# Patient Record
Sex: Male | Born: 1960
Health system: Southern US, Community
[De-identification: ages and names within clinical notes are randomized; demographics above are authoritative.]

## PROBLEM LIST (undated history)

## (undated) DIAGNOSIS — E039 Hypothyroidism, unspecified: Secondary | ICD-10-CM

## (undated) DIAGNOSIS — E042 Nontoxic multinodular goiter: Secondary | ICD-10-CM

## (undated) DIAGNOSIS — C61 Malignant neoplasm of prostate: Secondary | ICD-10-CM

## (undated) DIAGNOSIS — Z9889 Other specified postprocedural states: Secondary | ICD-10-CM

## (undated) DIAGNOSIS — K5090 Crohn's disease, unspecified, without complications: Secondary | ICD-10-CM

## (undated) DIAGNOSIS — R112 Nausea with vomiting, unspecified: Secondary | ICD-10-CM

## (undated) DIAGNOSIS — K219 Gastro-esophageal reflux disease without esophagitis: Secondary | ICD-10-CM

## (undated) DIAGNOSIS — K589 Irritable bowel syndrome without diarrhea: Secondary | ICD-10-CM

## (undated) HISTORY — DX: Crohn's disease, unspecified, without complications: K50.90

## (undated) HISTORY — DX: Gastro-esophageal reflux disease without esophagitis: K21.9

## (undated) HISTORY — PX: APPENDECTOMY: SHX54

## (undated) HISTORY — DX: Nontoxic multinodular goiter: E04.2

## (undated) HISTORY — DX: Irritable bowel syndrome, unspecified: K58.9

## (undated) HISTORY — DX: Malignant neoplasm of prostate: C61

## (undated) HISTORY — PX: KNEE ARTHROSCOPY W/ MENISCAL REPAIR: SHX1877

---

## 2003-11-05 ENCOUNTER — Encounter: Admission: RE | Admit: 2003-11-05 | Discharge: 2003-11-05 | Payer: Self-pay | Admitting: Neurological Surgery

## 2003-11-26 ENCOUNTER — Encounter: Admission: RE | Admit: 2003-11-26 | Discharge: 2003-11-26 | Payer: Self-pay | Admitting: Neurological Surgery

## 2004-11-03 ENCOUNTER — Emergency Department: Payer: Self-pay | Admitting: Emergency Medicine

## 2004-11-03 ENCOUNTER — Emergency Department: Payer: Self-pay | Admitting: Unknown Physician Specialty

## 2005-02-24 ENCOUNTER — Emergency Department: Payer: Self-pay | Admitting: Emergency Medicine

## 2005-05-22 ENCOUNTER — Emergency Department (HOSPITAL_COMMUNITY): Admission: EM | Admit: 2005-05-22 | Discharge: 2005-05-22 | Payer: Self-pay | Admitting: Emergency Medicine

## 2005-05-22 ENCOUNTER — Emergency Department: Payer: Self-pay | Admitting: Emergency Medicine

## 2006-04-24 ENCOUNTER — Emergency Department (HOSPITAL_COMMUNITY): Admission: EM | Admit: 2006-04-24 | Discharge: 2006-04-25 | Payer: Self-pay | Admitting: Emergency Medicine

## 2007-05-12 ENCOUNTER — Emergency Department: Payer: Self-pay | Admitting: Emergency Medicine

## 2008-01-10 ENCOUNTER — Emergency Department: Payer: Self-pay | Admitting: Emergency Medicine

## 2008-06-18 ENCOUNTER — Ambulatory Visit: Payer: Self-pay

## 2008-07-23 ENCOUNTER — Ambulatory Visit: Payer: Self-pay | Admitting: Unknown Physician Specialty

## 2008-07-27 ENCOUNTER — Ambulatory Visit: Payer: Self-pay | Admitting: Unknown Physician Specialty

## 2010-06-01 HISTORY — PX: ROBOT ASSISTED LAPAROSCOPIC RADICAL PROSTATECTOMY: SHX5141

## 2010-06-22 ENCOUNTER — Encounter: Payer: Self-pay | Admitting: Neurological Surgery

## 2010-06-23 ENCOUNTER — Ambulatory Visit: Payer: Self-pay | Admitting: Urology

## 2010-12-12 LAB — HM COLONOSCOPY

## 2013-01-23 ENCOUNTER — Ambulatory Visit (INDEPENDENT_AMBULATORY_CARE_PROVIDER_SITE_OTHER): Payer: Managed Care, Other (non HMO) | Admitting: Internal Medicine

## 2013-01-23 ENCOUNTER — Encounter: Payer: Self-pay | Admitting: Internal Medicine

## 2013-01-23 VITALS — BP 110/70 | HR 69 | Temp 98.6°F | Ht 67.0 in | Wt 170.0 lb

## 2013-01-23 DIAGNOSIS — K219 Gastro-esophageal reflux disease without esophagitis: Secondary | ICD-10-CM

## 2013-01-23 DIAGNOSIS — K589 Irritable bowel syndrome without diarrhea: Secondary | ICD-10-CM | POA: Insufficient documentation

## 2013-01-23 DIAGNOSIS — K5090 Crohn's disease, unspecified, without complications: Secondary | ICD-10-CM

## 2013-01-23 DIAGNOSIS — E042 Nontoxic multinodular goiter: Secondary | ICD-10-CM

## 2013-01-23 DIAGNOSIS — E890 Postprocedural hypothyroidism: Secondary | ICD-10-CM | POA: Insufficient documentation

## 2013-01-23 DIAGNOSIS — J029 Acute pharyngitis, unspecified: Secondary | ICD-10-CM

## 2013-01-23 DIAGNOSIS — Z8546 Personal history of malignant neoplasm of prostate: Secondary | ICD-10-CM | POA: Insufficient documentation

## 2013-01-23 DIAGNOSIS — C61 Malignant neoplasm of prostate: Secondary | ICD-10-CM | POA: Insufficient documentation

## 2013-01-23 NOTE — Progress Notes (Signed)
Subjective:    Patient ID: Gerald Atkinson, male    DOB: 1961-02-03, 52 y.o.   MRN: [id number]  HPI Here with sore throat for about 5 days "can hear it in there" Started amoxicillin 4 days ago--- hasn't helped  Doing okay during the day but it burns at night Slight cough--occ brings up some whitish phlegm No fever Does have burning with swallowing---no dysphagia though  Gargled with salt water last night--slight help advil some help  Had goiter found years ago-- 1990's Got radioactive iodine Probably multinodular goiter Sees Dr Tedd Sias is following---has gotten a couple of biopsies over time  GERD is ongoing thing Started with Crohn's diagnosis Gets water brash if he doesn't take omeprazole  Crohn's diagnosed ~1999 Last saw Dr Servando Snare for this in 2012 Some activity on colonoscopy then--or some narrowing? pentasa no help Now sees Dr Johnnette Litter steroid product and he didn't want it He feels it is diet controlled Just cramping --no diarrhea. Better if he avoids lactose levsin does help it No bleeding with stools Does take probiotics Had been hospitalized multiple times---but none in the past 5 years Never had fistula  Prostate cancer Prostatectomy 2012 Sees Dr Achilles Dunk  No current outpatient prescriptions on file prior to visit.   No current facility-administered medications on file prior to visit.    No Known Allergies  Past Medical History  Diagnosis Date  . Crohn disease   . IBS (irritable bowel syndrome)   . GERD (gastroesophageal reflux disease)   . Prostate cancer   . Multinodular goiter     with hypothyroidism after RAI    Past Surgical History  Procedure Laterality Date  . Robot assisted laparoscopic radical prostatectomy  2012  . Appendectomy    . Knee arthroscopy w/ meniscal repair Bilateral ~2000 &2009    Dr Erin Sons    Family History  Problem Relation Age of Onset  . Cancer Father     bladder  . Diabetes Neg Hx   . Heart disease  Neg Hx     History   Social History  . Marital Status: Divorced    Spouse Name: N/A    Number of Children: 2  . Years of Education: N/A   Occupational History  . Dentist    Social History Main Topics  . Smoking status: Never Smoker   . Smokeless tobacco: Never Used  . Alcohol Use: Yes  . Drug Use: No  . Sexual Activity: Not on file   Other Topics Concern  . Not on file   Social History Narrative  . No narrative on file   Review of Systems  Constitutional: Negative for fatigue and unexpected weight change.  HENT: Positive for sore throat and trouble swallowing.   Respiratory: Positive for cough and wheezing. Negative for shortness of breath.        Slight wheezing with this  Cardiovascular: Positive for chest pain.       Gets chest pain a couple of days after doing weight work in gym  Gastrointestinal: Positive for abdominal pain. Negative for blood in stool and anal bleeding.       Intermittent cramping  Genitourinary: Negative for difficulty urinating.       Occ incontinence since prostatectomy  Musculoskeletal: Positive for back pain. Negative for joint swelling and arthralgias.       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  Psychiatric/Behavioral: Negative for sleep disturbance  and dysphoric mood. The patient is not nervous/anxious.        Objective:   Physical Exam  Constitutional: He appears well-developed and well-nourished. No distress.  HENT:  Mild pharyngeal injection No exudates No tonsillar swelling or evidence of abscess TMs normal Mild nasal inflammation  Neck: Normal range of motion. Neck supple. Thyromegaly present.  Nodular thyroid felt Not overly enlarged  Cardiovascular: Normal rate, regular rhythm and normal heart sounds.  Exam reveals no gallop.   No murmur heard. Pulmonary/Chest: Effort normal and breath sounds normal. No  respiratory distress. He has no wheezes. He has no rales.  Abdominal: Soft. He exhibits no distension. There is no tenderness. There is no rebound and no guarding.  Musculoskeletal: He exhibits no edema and no tenderness.  Lymphadenopathy:    He has no cervical adenopathy.  Psychiatric: He has a normal mood and affect. His behavior is normal.          Assessment & Plan:

## 2013-01-23 NOTE — Assessment & Plan Note (Signed)
Seems to be quiet Should probably get colonoscopy again next year or so to confirm not active

## 2013-01-23 NOTE — Assessment & Plan Note (Signed)
Small Hypothyroid now presumably Dr Tedd Sias follows

## 2013-01-23 NOTE — Assessment & Plan Note (Signed)
Seems to be viral No signs of abscess No better on antibiotic Discussed supportive care

## 2013-01-23 NOTE — Assessment & Plan Note (Signed)
Does okay on PPI Symptoms if he doesn't

## 2013-07-31 ENCOUNTER — Encounter: Payer: Managed Care, Other (non HMO) | Admitting: Internal Medicine

## 2013-12-11 ENCOUNTER — Encounter: Payer: Self-pay | Admitting: Internal Medicine

## 2013-12-11 ENCOUNTER — Ambulatory Visit (INDEPENDENT_AMBULATORY_CARE_PROVIDER_SITE_OTHER): Payer: Managed Care, Other (non HMO) | Admitting: Internal Medicine

## 2013-12-11 VITALS — BP 110/70 | HR 61 | Temp 98.0°F | Ht 67.5 in | Wt 171.0 lb

## 2013-12-11 DIAGNOSIS — K5090 Crohn's disease, unspecified, without complications: Secondary | ICD-10-CM

## 2013-12-11 DIAGNOSIS — C61 Malignant neoplasm of prostate: Secondary | ICD-10-CM

## 2013-12-11 DIAGNOSIS — E890 Postprocedural hypothyroidism: Secondary | ICD-10-CM

## 2013-12-11 DIAGNOSIS — Z0000 Encounter for general adult medical examination without abnormal findings: Secondary | ICD-10-CM | POA: Insufficient documentation

## 2013-12-11 NOTE — Assessment & Plan Note (Signed)
Sees Dr Gabriel Carina Will check labs

## 2013-12-11 NOTE — Assessment & Plan Note (Signed)
Fairly healthy UTD with imms and cancer screening

## 2013-12-11 NOTE — Assessment & Plan Note (Signed)
Has been quiet 

## 2013-12-11 NOTE — Progress Notes (Signed)
Pre visit review using our clinic review tool, if applicable. No additional management support is needed unless otherwise documented below in the visit note. 

## 2013-12-11 NOTE — Progress Notes (Signed)
Subjective:    Patient ID: Gerald Atkinson, male    DOB: 14-Jan-1961, 53 y.o.   MRN: [id number]  HPI Here for physical Did have a friend die of early heart disease recently Has had elevated cholesterol in the past--has tried omega-3, etc for dietary treatment. This did help Walks regularly --has long driveway (like 3 miles) Does push ups also  Crohn's is doing well Uses probiotics for many years-- added prebiotic recently and this has really helped. No discomfort after eating food now Colonoscopy with Dr Allen Norris about 3 years ago--was okay  Had been on prilosec for a while If not, he gets water brash This is better with the prebiotic also Now taking ranitidine  Current Outpatient Prescriptions on File Prior to Visit  Medication Sig Dispense Refill  . hyoscyamine (LEVSIN SL) 0.125 MG SL tablet Place 0.125 mg under the tongue every 4 (four) hours as needed.      Marland Kitchen levothyroxine (SYNTHROID, LEVOTHROID) 150 MCG tablet Take 150 mcg by mouth daily before breakfast.      . liothyronine (CYTOMEL) 5 MCG tablet Take 5 mcg by mouth daily.       No current facility-administered medications on file prior to visit.    No Known Allergies  Past Medical History  Diagnosis Date  . Crohn disease   . IBS (irritable bowel syndrome)   . GERD (gastroesophageal reflux disease)   . Prostate cancer   . Multinodular goiter     with hypothyroidism after RAI    Past Surgical History  Procedure Laterality Date  . Robot assisted laparoscopic radical prostatectomy  2012  . Appendectomy    . Knee arthroscopy w/ meniscal repair Bilateral ~2000 &2009    Dr Leanor Kail    Family History  Problem Relation Age of Onset  . Cancer Father     bladder  . Diabetes Neg Hx   . Heart disease Neg Hx     History   Social History  . Marital Status: Divorced    Spouse Name: N/A    Number of Children: 2  . Years of Education: N/A   Occupational History  . Dentist    Social History Main Topics  .  Smoking status: Never Smoker   . Smokeless tobacco: Never Used  . Alcohol Use: Yes  . Drug Use: No  . Sexual Activity: Not on file   Other Topics Concern  . Not on file   Social History Narrative  . No narrative on file   Review of Systems  Constitutional: Negative for fatigue and unexpected weight change.       Wears seat belt  HENT: Negative for dental problem, hearing loss and tinnitus.   Eyes: Negative for visual disturbance.       No diplopia or unilateral vision loss  Respiratory: Negative for cough, chest tightness and shortness of breath.   Cardiovascular: Negative for chest pain, palpitations and leg swelling.  Gastrointestinal: Negative for nausea, vomiting, constipation and blood in stool.  Endocrine: Positive for cold intolerance. Negative for heat intolerance.       Hands stay cold Still sees Dr Gabriel Carina  Genitourinary: Negative for difficulty urinating.       Occ leakage --like with beer No major ED now Hasn't seen Dr Jacqlyn Larsen lately  Musculoskeletal: Positive for arthralgias, back pain and myalgias.       Will get more muscle pain when doing weights (so he has cut down on it) Right 3rd finger pain--saw Dr Precious Reel  some years ago  Skin: Negative for rash.       No suspicious lesions--yearly exam with Dr Evorn Gong  Allergic/Immunologic: Negative for environmental allergies and immunocompromised state.  Neurological: Negative for dizziness, syncope, weakness, light-headedness, numbness and headaches.       Occ sciatic pain from back--sees Dr Joyce Copa regularly  Hematological: Negative for adenopathy. Does not bruise/bleed easily.  Psychiatric/Behavioral: Negative for sleep disturbance and dysphoric mood. The patient is not nervous/anxious.        Sleeps in adjustable bed       Objective:   Physical Exam  Constitutional: He is oriented to person, place, and time. He appears well-developed and well-nourished. No distress.  HENT:  Head: Normocephalic and atraumatic.    Right Ear: External ear normal.  Left Ear: External ear normal.  Mouth/Throat: Oropharynx is clear and moist. No oropharyngeal exudate.  Eyes: Conjunctivae and EOM are normal. Pupils are equal, round, and reactive to light.  Neck: Normal range of motion. Neck supple. No thyromegaly present.  Cardiovascular: Normal rate, regular rhythm, normal heart sounds and intact distal pulses.  Exam reveals no gallop.   No murmur heard. Pulmonary/Chest: Effort normal and breath sounds normal. No respiratory distress. He has no wheezes. He has no rales.  Abdominal: Soft. There is no tenderness.  Musculoskeletal: He exhibits no edema and no tenderness.  Lymphadenopathy:    He has no cervical adenopathy.  Neurological: He is alert and oriented to person, place, and time.  Skin: No rash noted. No erythema.  Multiple benign nevi  Psychiatric: He has a normal mood and affect. His behavior is normal.          Assessment & Plan:

## 2013-12-11 NOTE — Assessment & Plan Note (Signed)
3 years ago Will check PSA

## 2013-12-12 LAB — CBC WITH DIFFERENTIAL/PLATELET
BASOS ABS: 0 10*3/uL (ref 0.0–0.1)
Basophils Relative: 0.3 % (ref 0.0–3.0)
EOS PCT: 1.5 % (ref 0.0–5.0)
Eosinophils Absolute: 0.1 10*3/uL (ref 0.0–0.7)
HCT: 40.7 % (ref 39.0–52.0)
Hemoglobin: 13.7 g/dL (ref 13.0–17.0)
LYMPHS ABS: 2.3 10*3/uL (ref 0.7–4.0)
LYMPHS PCT: 24.6 % (ref 12.0–46.0)
MCHC: 33.6 g/dL (ref 30.0–36.0)
MCV: 89.9 fl (ref 78.0–100.0)
MONOS PCT: 6.4 % (ref 3.0–12.0)
Monocytes Absolute: 0.6 10*3/uL (ref 0.1–1.0)
NEUTROS PCT: 67.2 % (ref 43.0–77.0)
Neutro Abs: 6.3 10*3/uL (ref 1.4–7.7)
PLATELETS: 275 10*3/uL (ref 150.0–400.0)
RBC: 4.53 Mil/uL (ref 4.22–5.81)
RDW: 13.3 % (ref 11.5–15.5)
WBC: 9.3 10*3/uL (ref 4.0–10.5)

## 2013-12-12 LAB — LIPID PANEL
CHOLESTEROL: 187 mg/dL (ref 0–200)
HDL: 39.6 mg/dL (ref >39.00)
LDL Cholesterol: 128 mg/dL — ABNORMAL HIGH (ref 0–99)
NONHDL: 147.4
TRIGLYCERIDES: 98 mg/dL (ref 0.0–149.0)
Total CHOL/HDL Ratio: 5
VLDL: 19.6 mg/dL (ref 0.0–40.0)

## 2013-12-12 LAB — COMPREHENSIVE METABOLIC PANEL
ALBUMIN: 3.7 g/dL (ref 3.5–5.2)
ALT: 16 U/L (ref 0–53)
AST: 18 U/L (ref 0–37)
Alkaline Phosphatase: 45 U/L (ref 39–117)
BILIRUBIN TOTAL: 0.6 mg/dL (ref 0.2–1.2)
BUN: 17 mg/dL (ref 6–23)
CALCIUM: 9 mg/dL (ref 8.4–10.5)
CHLORIDE: 104 meq/L (ref 96–112)
CO2: 26 meq/L (ref 19–32)
Creatinine, Ser: 1 mg/dL (ref 0.4–1.5)
GFR: 87.59 mL/min (ref >60.00)
GLUCOSE: 93 mg/dL (ref 70–99)
Potassium: 4.2 mEq/L (ref 3.5–5.1)
SODIUM: 139 meq/L (ref 135–145)
TOTAL PROTEIN: 6.3 g/dL (ref 6.0–8.3)

## 2013-12-12 LAB — TSH: TSH: 2.51 u[IU]/mL (ref 0.35–4.50)

## 2013-12-12 LAB — T3, FREE: T3, Free: 3 pg/mL (ref 2.3–4.2)

## 2013-12-12 LAB — T4, FREE: FREE T4: 1.13 ng/dL (ref 0.60–1.60)

## 2013-12-12 LAB — PSA: PSA: 0.01 ng/mL — ABNORMAL LOW (ref 0.10–4.00)

## 2013-12-13 ENCOUNTER — Encounter: Payer: Self-pay | Admitting: *Deleted

## 2015-10-02 ENCOUNTER — Telehealth: Payer: Self-pay

## 2015-10-02 NOTE — Telephone Encounter (Signed)
Bright Red Rectal Bleeding today after having bowel movement. States that their was only a few drops in the toilet and then had to wipe several times due to bloody toilet paper, no bleeding in underwear. Has only occurred once today. Denies abdominal pain. Denies Constipation or diarrhea and nausea or vomiting.   Has Crohn's Disease per patient. Has also had rectal fissures in past but is not having any rectal pain and feels that this is different.  Requesting colonoscopy as last one with Dr. Allen Norris was 5 years ago.

## 2015-10-03 ENCOUNTER — Other Ambulatory Visit: Payer: Self-pay

## 2015-10-03 ENCOUNTER — Telehealth: Payer: Self-pay

## 2015-10-03 NOTE — Telephone Encounter (Signed)
Pt scheduled for colonoscopy at [facility] on 10/14/15. Please precert for Crohn's ([id number])

## 2015-10-03 NOTE — Telephone Encounter (Signed)
Gastroenterology Pre-Procedure Review  Request Date: 10/14/15 Requesting Physician: Dr.   PATIENT REVIEW QUESTIONS: The patient responded to the following health history questions as indicated:    1. Are you having any GI issues? yes (Crohn's disease) 2. Do you have a personal history of Polyps? no 3. Do you have a family history of Colon Cancer or Polyps? no 4. Diabetes Mellitus? no 5. Joint replacements in the past 12 months?no 6. Major health problems in the past 3 months?no 7. Any artificial heart valves, MVP, or defibrillator?no    MEDICATIONS & ALLERGIES:    Patient reports the following regarding taking any anticoagulation/antiplatelet therapy:   Plavix, Coumadin, Eliquis, Xarelto, Lovenox, Pradaxa, Brilinta, or Effient? no Aspirin? yes (ASA 81mg )  Patient confirms/reports the following medications:  Current Outpatient Prescriptions  Medication Sig Dispense Refill  . aspirin EC 81 MG tablet Take 81 mg by mouth every other day.    . hyoscyamine (LEVSIN SL) 0.125 MG SL tablet Place 0.125 mg under the tongue every 4 (four) hours as needed.    Marland Kitchen levothyroxine (SYNTHROID, LEVOTHROID) 150 MCG tablet Take 150 mcg by mouth daily before breakfast.    . liothyronine (CYTOMEL) 5 MCG tablet Take 5 mcg by mouth daily.    . ranitidine (ZANTAC) 150 MG capsule Take 150 mg by mouth every evening.     No current facility-administered medications for this visit.    Patient confirms/reports the following allergies:  No Known Allergies  No orders of the defined types were placed in this encounter.    AUTHORIZATION INFORMATION Primary Insurance: 1D#: Group #:  Secondary Insurance: 1D#: Group #:  SCHEDULE INFORMATION: Date: 10/14/15 Time: Location: [place]

## 2015-10-03 NOTE — Telephone Encounter (Signed)
Colonoscopy scheduled at [facility] on 10/14/15. Instructs/rx mailed.

## 2015-10-10 NOTE — Discharge Instructions (Signed)

## 2015-10-14 ENCOUNTER — Ambulatory Visit: Payer: Managed Care, Other (non HMO) | Admitting: Anesthesiology

## 2015-10-14 ENCOUNTER — Ambulatory Visit
Admission: RE | Admit: 2015-10-14 | Discharge: 2015-10-14 | Disposition: A | Discharge status: Home / Self Care | Payer: Managed Care, Other (non HMO) | Source: Ambulatory Visit | Attending: Gastroenterology | Admitting: Gastroenterology

## 2015-10-14 ENCOUNTER — Encounter
Admission: RE | Disposition: A | Discharge status: Home / Self Care | Payer: Self-pay | Source: Ambulatory Visit | Attending: Gastroenterology

## 2015-10-14 DIAGNOSIS — Z8052 Family history of malignant neoplasm of bladder: Secondary | ICD-10-CM | POA: Insufficient documentation

## 2015-10-14 DIAGNOSIS — K219 Gastro-esophageal reflux disease without esophagitis: Secondary | ICD-10-CM | POA: Insufficient documentation

## 2015-10-14 DIAGNOSIS — Z79899 Other long term (current) drug therapy: Secondary | ICD-10-CM | POA: Diagnosis not present

## 2015-10-14 DIAGNOSIS — K589 Irritable bowel syndrome without diarrhea: Secondary | ICD-10-CM | POA: Diagnosis not present

## 2015-10-14 DIAGNOSIS — K5730 Diverticulosis of large intestine without perforation or abscess without bleeding: Secondary | ICD-10-CM | POA: Diagnosis not present

## 2015-10-14 DIAGNOSIS — K5000 Crohn's disease of small intestine without complications: Secondary | ICD-10-CM | POA: Diagnosis not present

## 2015-10-14 DIAGNOSIS — E039 Hypothyroidism, unspecified: Secondary | ICD-10-CM | POA: Insufficient documentation

## 2015-10-14 DIAGNOSIS — K529 Noninfective gastroenteritis and colitis, unspecified: Secondary | ICD-10-CM | POA: Diagnosis not present

## 2015-10-14 DIAGNOSIS — K640 First degree hemorrhoids: Secondary | ICD-10-CM | POA: Insufficient documentation

## 2015-10-14 DIAGNOSIS — K5080 Crohn's disease of both small and large intestine without complications: Secondary | ICD-10-CM | POA: Insufficient documentation

## 2015-10-14 DIAGNOSIS — K633 Ulcer of intestine: Secondary | ICD-10-CM | POA: Insufficient documentation

## 2015-10-14 DIAGNOSIS — Z8546 Personal history of malignant neoplasm of prostate: Secondary | ICD-10-CM | POA: Diagnosis not present

## 2015-10-14 HISTORY — DX: Nausea with vomiting, unspecified: R11.2

## 2015-10-14 HISTORY — DX: Other specified postprocedural states: Z98.890

## 2015-10-14 HISTORY — DX: Hypothyroidism, unspecified: E03.9

## 2015-10-14 HISTORY — PX: COLONOSCOPY WITH PROPOFOL: SHX5780

## 2015-10-14 SURGERY — COLONOSCOPY WITH PROPOFOL
Anesthesia: Monitor Anesthesia Care

## 2015-10-14 MED ORDER — PROPOFOL 10 MG/ML IV BOLUS
INTRAVENOUS | Status: DC | PRN
  Administered 2015-10-14 (×2): 50 mg via INTRAVENOUS
  Administered 2015-10-14: 100 mg via INTRAVENOUS

## 2015-10-14 MED ORDER — LIDOCAINE HCL (CARDIAC) 20 MG/ML IV SOLN
INTRAVENOUS | Status: DC | PRN
  Administered 2015-10-14: 40 mg via INTRAVENOUS

## 2015-10-14 MED ORDER — LACTATED RINGERS IV SOLN
INTRAVENOUS | Status: DC
  Administered 2015-10-14 (×2): via INTRAVENOUS

## 2015-10-14 MED ORDER — STERILE WATER FOR IRRIGATION IR SOLN
Status: DC | PRN
  Administered 2015-10-14: 08:00:00

## 2015-10-14 SURGICAL SUPPLY — 22 items
CANISTER SUCT 1200ML W/VALVE (MISCELLANEOUS) ×3 IMPLANT
CLIP HMST 235XBRD CATH ROT (MISCELLANEOUS) IMPLANT
CLIP RESOLUTION 360 11X235 (MISCELLANEOUS)
FCP ESCP3.2XJMB 240X2.8X (MISCELLANEOUS)
FORCEPS BIOP RAD 4 LRG CAP 4 (CUTTING FORCEPS) IMPLANT
FORCEPS BIOP RJ4 240 W/NDL (MISCELLANEOUS)
FORCEPS ESCP3.2XJMB 240X2.8X (MISCELLANEOUS) IMPLANT
GOWN CVR UNV OPN BCK APRN NK (MISCELLANEOUS) ×2 IMPLANT
GOWN ISOL THUMB LOOP REG UNIV (MISCELLANEOUS) ×6
INJECTOR VARIJECT VIN23 (MISCELLANEOUS) IMPLANT
KIT DEFENDO VALVE AND CONN (KITS) IMPLANT
KIT ENDO PROCEDURE OLY (KITS) ×3 IMPLANT
MARKER SPOT ENDO TATTOO 5ML (MISCELLANEOUS) IMPLANT
PAD GROUND ADULT SPLIT (MISCELLANEOUS) IMPLANT
PROBE APC STR FIRE (PROBE) IMPLANT
SNARE SHORT THROW 13M SML OVAL (MISCELLANEOUS) ×2 IMPLANT
SNARE SHORT THROW 30M LRG OVAL (MISCELLANEOUS) IMPLANT
SNARE SNG USE RND 15MM (INSTRUMENTS) IMPLANT
SPOT EX ENDOSCOPIC TATTOO (MISCELLANEOUS)
TRAP ETRAP POLY (MISCELLANEOUS) IMPLANT
VARIJECT INJECTOR VIN23 (MISCELLANEOUS)
WATER STERILE IRR 250ML POUR (IV SOLUTION) ×3 IMPLANT

## 2015-10-14 NOTE — Anesthesia Postprocedure Evaluation (Signed)
Anesthesia Post Note  Patient: Gerald Atkinson  Procedure(s) Performed: Procedure(s) (LRB): COLONOSCOPY WITH PROPOFOL (N/A)  Patient location during evaluation: PACU Anesthesia Type: MAC Level of consciousness: awake and alert Pain management: pain level controlled Vital Signs Assessment: post-procedure vital signs reviewed and stable Respiratory status: spontaneous breathing, nonlabored ventilation, respiratory function stable and patient connected to nasal cannula oxygen Cardiovascular status: stable and blood pressure returned to baseline Anesthetic complications: no    Crockett Rallo C

## 2015-10-14 NOTE — Transfer of Care (Signed)
Immediate Anesthesia Transfer of Care Note  Patient: Gerald Atkinson  Procedure(s) Performed: Procedure(s): COLONOSCOPY WITH PROPOFOL (N/A)  Patient Location: PACU  Anesthesia Type: MAC  Level of Consciousness: awake, alert  and patient cooperative  Airway and Oxygen Therapy: Patient Spontanous Breathing and Patient connected to supplemental oxygen  Post-op Assessment: Post-op Vital signs reviewed, Patient's Cardiovascular Status Stable, Respiratory Function Stable, Patent Airway and No signs of Nausea or vomiting  Post-op Vital Signs: Reviewed and stable  Complications: No apparent anesthesia complications

## 2015-10-14 NOTE — Anesthesia Procedure Notes (Signed)
Procedure Name: MAC Performed by: Miya Luviano Pre-anesthesia Checklist: Patient identified, Emergency Drugs available, Suction available, Patient being monitored and Timeout performed Patient Re-evaluated:Patient Re-evaluated prior to inductionOxygen Delivery Method: Nasal cannula       

## 2015-10-14 NOTE — Op Note (Signed)
[facility] Gastroenterology Patient Name: Kazim Pierman Procedure Date: 10/14/2015 8:09 AM MRN: [id number] Account #: [id number] Date of Birth: 1961/04/29 Admit Type: Outpatient Age: 55 Room: [facility] OR ROOM 01 Gender: Male Note Status: Finalized Procedure:            Colonoscopy Indications:          High risk colon cancer surveillance: Crohn's disease                        small intestine, High risk colon cancer surveillance:                        Crohn's small and large intestine Providers:            Lucilla Lame, MD Referring MD:         Venia Carbon (Referring MD) Medicines:            Propofol per Anesthesia Complications:        No immediate complications. Procedure:            Pre-Anesthesia Assessment:                       - Prior to the procedure, a History and Physical was                        performed, and patient medications and allergies were                        reviewed. The patient's tolerance of previous                        anesthesia was also reviewed. The risks and benefits of                        the procedure and the sedation options and risks were                        discussed with the patient. All questions were                        answered, and informed consent was obtained. Prior                        Anticoagulants: The patient has taken no previous                        anticoagulant or antiplatelet agents. ASA Grade                        Assessment: II - A patient with mild systemic disease.                        After reviewing the risks and benefits, the patient was                        deemed in satisfactory condition to undergo the                        procedure.  After obtaining informed consent, the colonoscope was                        passed under direct vision. Throughout the procedure,                        the patient's blood pressure, pulse, and oxygen      saturations were monitored continuously. The Olympus                        CF-HQ190L Colonoscope (S#. [id number]) was introduced                        through the anus and advanced to the the ileocecal                        valve. The colonoscopy was performed without                        difficulty. The patient tolerated the procedure well.                        The quality of the bowel preparation was excellent. Findings:      The perianal and digital rectal examinations were normal.      A few small-mouthed diverticula were found in the sigmoid colon.      Non-bleeding internal hemorrhoids were found during retroflexion. The       hemorrhoids were Grade I (internal hemorrhoids that do not prolapse).      Localized inflammation, moderate in severity and characterized by       serpentine ulcerations was found in the terminal ileum. Biopsies were       taken with a cold forceps for histology.      Discontinuous areas of nonbleeding ulcerated mucosa with no stigmata of       recent bleeding were present at the ileocecal valve. Impression:           - Diverticulosis in the sigmoid colon.                       - Non-bleeding internal hemorrhoids.                       - Crohn's disease. Biopsied.                       - Mucosal ulceration. Recommendation:       - Await pathology results. Procedure Code(s):    --- Professional ---                       [phone], Colonoscopy, flexible; with biopsy, single or                        multiple Diagnosis Code(s):    --- Professional ---                       K50.00, Crohn's disease of small intestine without                        complications  K64.0, First degree hemorrhoids                       K63.3, Ulcer of intestine                       K57.30, Diverticulosis of large intestine without                        perforation or abscess without bleeding CPT copyright 2016 American Medical Association. All rights  reserved. The codes documented in this report are preliminary and upon coder review may  be revised to meet current compliance requirements. Lucilla Lame, MD 10/14/2015 8:37:46 AM This report has been signed electronically. Number of Addenda: 0 Note Initiated On: 10/14/2015 8:09 AM Scope Withdrawal Time: 0 hours 6 minutes 52 seconds  Total Procedure Duration: 0 hours 10 minutes 5 seconds       [facility]

## 2015-10-14 NOTE — H&P (Signed)
[facility]  [address], [place], [place] [address] Phone: [phone] Fax : [phone]  Primary Care Physician:  Viviana Simpler, MD Primary Gastroenterologist:  Dr. Allen Norris  Pre-Procedure History & Physical: HPI:  Gerald Atkinson is a 55 y.o. male is here for an colonoscopy.   Past Medical History  Diagnosis Date  . Crohn disease ([place])   . IBS (irritable bowel syndrome)   . GERD (gastroesophageal reflux disease)   . Prostate cancer ([place])   . Multinodular goiter     with hypothyroidism after RAI  . Hypothyroidism   . PONV (postoperative nausea and vomiting)     Past Surgical History  Procedure Laterality Date  . Robot assisted laparoscopic radical prostatectomy  2012  . Appendectomy    . Knee arthroscopy w/ meniscal repair Bilateral ~2000 &2009    Dr Leanor Kail    Prior to Admission medications   Medication Sig Start Date End Date Taking? Authorizing Provider  APPLE CIDER VINEGAR PO Take by mouth. spoonful   Yes Historical Provider, MD  hyoscyamine (LEVSIN SL) 0.125 MG SL tablet Place 0.125 mg under the tongue every 4 (four) hours as needed.   Yes Historical Provider, MD  levothyroxine (SYNTHROID, LEVOTHROID) 150 MCG tablet Take 150 mcg by mouth daily. PM   Yes Historical Provider, MD  liothyronine (CYTOMEL) 5 MCG tablet Take 5 mcg by mouth daily.   Yes Historical Provider, MD  meloxicam (MOBIC) 15 MG tablet Take 15 mg by mouth daily.   Yes Historical Provider, MD  Omega-3 Fatty Acids (FISH OIL CONCENTRATE PO) Take by mouth. Spoonful cod liver oil   Yes Historical Provider, MD  Probiotic Product (PROBIOTIC DAILY PO) Take by mouth.   Yes Historical Provider, MD  ranitidine (ZANTAC) 150 MG capsule Take 150 mg by mouth every other day. am   Yes Historical Provider, MD    Allergies as of 10/03/2015  . (No Known Allergies)    Family History  Problem Relation Age of Onset  . Cancer Father     bladder  . Diabetes Neg Hx   . Heart disease Neg  Hx     Social History   Social History  . Marital Status: Divorced    Spouse Name: N/A  . Number of Children: 2  . Years of Education: N/A   Occupational History  . Dentist    Social History Main Topics  . Smoking status: Never Smoker   . Smokeless tobacco: Never Used  . Alcohol Use: 1.8 oz/week    3 Cans of beer per week  . Drug Use: No  . Sexual Activity: Not on file   Other Topics Concern  . Not on file   Social History Narrative    Review of Systems: See HPI, otherwise negative ROS  Physical Exam: BP 120/78 mmHg  Pulse 58  Temp(Src) 97.3 F (36.3 C) (Temporal)  Resp 16  Ht 5\' 7"  (1.702 m)  Wt 166 lb (75.297 kg)  BMI 25.99 kg/m2  SpO2 100% General:   Alert,  pleasant and cooperative in NAD Head:  Normocephalic and atraumatic. Neck:  Supple; no masses or thyromegaly. Lungs:  Clear throughout to auscultation.    Heart:  Regular rate and rhythm. Abdomen:  Soft, nontender and nondistended. Normal bowel sounds, without guarding, and without rebound.   Neurologic:  Alert and  oriented x4;  grossly normal neurologically.  Impression/Plan: Gerald Atkinson is here for an colonoscopy to be performed for Crohn's  Risks, benefits, limitations, and  alternatives regarding  colonoscopy have been reviewed with the patient.  Questions have been answered.  All parties agreeable.   Lucilla Lame, MD  10/14/2015, 8:12 AM

## 2015-10-14 NOTE — Anesthesia Preprocedure Evaluation (Signed)
Anesthesia Evaluation  Patient identified by MRN, date of birth, ID band Patient awake    Reviewed: Allergy & Precautions, NPO status , Patient's Chart, lab work & pertinent test results  Airway Mallampati: II  TM Distance: >3 FB Neck ROM: Full    Dental no notable dental hx.    Pulmonary neg pulmonary ROS,    Pulmonary exam normal breath sounds clear to auscultation       Cardiovascular negative cardio ROS Normal cardiovascular exam Rhythm:Regular Rate:Normal     Neuro/Psych negative neurological ROS  negative psych ROS   GI/Hepatic Neg liver ROS, GERD  Controlled,IBS, Crohn's   Endo/Other  Hypothyroidism   Renal/GU negative Renal ROS  negative genitourinary   Musculoskeletal negative musculoskeletal ROS (+)   Abdominal   Peds negative pediatric ROS (+)  Hematology negative hematology ROS (+)   Anesthesia Other Findings   Reproductive/Obstetrics negative OB ROS                             Anesthesia Physical Anesthesia Plan  ASA: II  Anesthesia Plan: MAC   Post-op Pain Management:    Induction: Intravenous  Airway Management Planned:   Additional Equipment:   Intra-op Plan:   Post-operative Plan: Extubation in OR  Informed Consent: I have reviewed the patients History and Physical, chart, labs and discussed the procedure including the risks, benefits and alternatives for the proposed anesthesia with the patient or authorized representative who has indicated his/her understanding and acceptance.   Dental advisory given  Plan Discussed with: CRNA  Anesthesia Plan Comments:         Anesthesia Quick Evaluation

## 2015-10-15 ENCOUNTER — Encounter: Payer: Self-pay | Admitting: Gastroenterology

## 2015-10-17 ENCOUNTER — Encounter: Payer: Self-pay | Admitting: Gastroenterology

## 2015-10-22 ENCOUNTER — Telehealth: Payer: Self-pay

## 2015-10-22 NOTE — Telephone Encounter (Signed)
-----   Message from Lucilla Lame, MD sent at 10/21/2015 12:18 PM EDT ----- Let the patient know that his biopsies of the small bowel showed him to have active inflammation caused by his Crohn's disease. 5 out what the patient's medications at the present time are and if he is taking any of them for his Crohn's if not he should be started on Pentasa 1 g 4 times a day for 8 weeks.

## 2015-10-22 NOTE — Telephone Encounter (Signed)
LVM for pt to return my call.

## 2015-11-01 NOTE — Telephone Encounter (Signed)
Pt stated he not currently taking anything for his Crohn's because nothing ever seemed to work for him. He only does probiotics. Pt has requested for now for me to mail him the pathology results. Advised him he does currently have some active inflammation and he may want to consider taking something to calm this down. Pt has passed on the medication. Results mailed.

## 2015-11-01 NOTE — Telephone Encounter (Signed)
LVM again for pt to return my call.  

## 2015-11-06 ENCOUNTER — Other Ambulatory Visit: Payer: Self-pay | Admitting: Student

## 2015-11-06 DIAGNOSIS — G8929 Other chronic pain: Secondary | ICD-10-CM

## 2015-11-06 DIAGNOSIS — M47816 Spondylosis without myelopathy or radiculopathy, lumbar region: Secondary | ICD-10-CM

## 2015-11-06 DIAGNOSIS — M25511 Pain in right shoulder: Principal | ICD-10-CM

## 2015-12-09 ENCOUNTER — Telehealth: Payer: Self-pay | Admitting: Gastroenterology

## 2015-12-09 ENCOUNTER — Ambulatory Visit: Payer: Managed Care, Other (non HMO)

## 2015-12-09 ENCOUNTER — Ambulatory Visit
Admission: RE | Admit: 2015-12-09 | Discharge: 2015-12-09 | Disposition: A | Discharge status: Home / Self Care | Payer: Managed Care, Other (non HMO) | Source: Ambulatory Visit | Attending: Student | Admitting: Student

## 2015-12-09 DIAGNOSIS — X58XXXA Exposure to other specified factors, initial encounter: Secondary | ICD-10-CM | POA: Diagnosis not present

## 2015-12-09 DIAGNOSIS — M25811 Other specified joint disorders, right shoulder: Secondary | ICD-10-CM | POA: Insufficient documentation

## 2015-12-09 DIAGNOSIS — M47816 Spondylosis without myelopathy or radiculopathy, lumbar region: Secondary | ICD-10-CM

## 2015-12-09 DIAGNOSIS — R609 Edema, unspecified: Secondary | ICD-10-CM | POA: Insufficient documentation

## 2015-12-09 DIAGNOSIS — M12811 Other specific arthropathies, not elsewhere classified, right shoulder: Secondary | ICD-10-CM | POA: Insufficient documentation

## 2015-12-09 DIAGNOSIS — S46021A Laceration of muscle(s) and tendon(s) of the rotator cuff of right shoulder, initial encounter: Secondary | ICD-10-CM | POA: Diagnosis not present

## 2015-12-09 DIAGNOSIS — S43491A Other sprain of right shoulder joint, initial encounter: Secondary | ICD-10-CM | POA: Diagnosis not present

## 2015-12-09 DIAGNOSIS — G8929 Other chronic pain: Secondary | ICD-10-CM

## 2015-12-09 DIAGNOSIS — M25511 Pain in right shoulder: Secondary | ICD-10-CM

## 2015-12-09 NOTE — Telephone Encounter (Signed)
You previously told patient you could call him in some medication but he declined but now would like them. Please call.

## 2015-12-10 ENCOUNTER — Other Ambulatory Visit: Payer: Self-pay

## 2015-12-10 ENCOUNTER — Telehealth: Payer: Self-pay | Admitting: Internal Medicine

## 2015-12-10 DIAGNOSIS — K50919 Crohn's disease, unspecified, with unspecified complications: Secondary | ICD-10-CM

## 2015-12-10 MED ORDER — MESALAMINE ER 500 MG PO CPCR: 1000.0000 mg | ORAL_CAPSULE | Freq: Four times a day (QID) | ORAL | Status: DC

## 2015-12-10 NOTE — Telephone Encounter (Signed)
Left vm letting pt know rx for Pentesa has been sent to [facility] on [address].

## 2015-12-10 NOTE — Telephone Encounter (Signed)
Pt called and would like a return call regarding his back pain, states he is having possible kidney stone symptoms and wants to speak to Dr. Silvio Pate.  Pt declined the CPE appointment offered for 12/11/15 as it has been 2 years since seen.  Best number to call is [phone]

## 2015-12-10 NOTE — Telephone Encounter (Signed)
Having evening cramping pain mostly in evenings---hyoscamine helps. Felt it is mild Crohn's related (colon recently did show some ulcerations--was given pentasa to start) Some low back pain--just had MRI Last night had severe low back pain---burning type Some of the pain in lower abdomen bilaterally Mostly seems to be in evening and passes a lot of gas Generally better in AM  Told him it doesn't sound like kidney stone (which he has never had before) Needs to start the pentasa today No meloxicam for now in case affecting Crohns Needs visit (here or GI) if ongoing problems

## 2015-12-12 ENCOUNTER — Telehealth: Payer: Self-pay

## 2015-12-12 NOTE — Telephone Encounter (Signed)
Patient called in with Abdominal Pain radiating to low back. Just started on Pentasa 12/10/15. Patient does not feel that this is helping and would like to see Dr. Allen Norris. I explained to the patient that he has not given the medication time to work.  Patient placed on schedule for 01/20/16 with Dr. Allen Norris and Ginger notified.

## 2015-12-17 DIAGNOSIS — S43431A Superior glenoid labrum lesion of right shoulder, initial encounter: Secondary | ICD-10-CM | POA: Insufficient documentation

## 2015-12-17 DIAGNOSIS — M47816 Spondylosis without myelopathy or radiculopathy, lumbar region: Secondary | ICD-10-CM | POA: Insufficient documentation

## 2016-01-10 ENCOUNTER — Other Ambulatory Visit: Payer: Self-pay | Admitting: Student

## 2016-01-10 DIAGNOSIS — M5416 Radiculopathy, lumbar region: Secondary | ICD-10-CM

## 2016-01-20 ENCOUNTER — Ambulatory Visit
Admission: RE | Admit: 2016-01-20 | Discharge: 2016-01-20 | Disposition: A | Discharge status: Home / Self Care | Payer: Managed Care, Other (non HMO) | Source: Ambulatory Visit | Attending: Student | Admitting: Student

## 2016-01-20 ENCOUNTER — Ambulatory Visit (INDEPENDENT_AMBULATORY_CARE_PROVIDER_SITE_OTHER): Payer: Managed Care, Other (non HMO) | Admitting: Gastroenterology

## 2016-01-20 ENCOUNTER — Encounter: Payer: Self-pay | Admitting: Gastroenterology

## 2016-01-20 VITALS — BP 108/68 | HR 57 | Temp 98.4°F | Ht 67.5 in | Wt 167.0 lb

## 2016-01-20 DIAGNOSIS — K50919 Crohn's disease, unspecified, with unspecified complications: Secondary | ICD-10-CM

## 2016-01-20 DIAGNOSIS — M5416 Radiculopathy, lumbar region: Secondary | ICD-10-CM

## 2016-01-20 MED ORDER — IOPAMIDOL (ISOVUE-M 200) INJECTION 41%
1.0000 mL | Freq: Once | INTRAMUSCULAR | Status: AC
  Administered 2016-01-20: 1 mL via EPIDURAL

## 2016-01-20 MED ORDER — METHYLPREDNISOLONE ACETATE 40 MG/ML INJ SUSP (RADIOLOG
120.0000 mg | Freq: Once | INTRAMUSCULAR | Status: AC
  Administered 2016-01-20: 120 mg via EPIDURAL

## 2016-01-20 NOTE — Discharge Instructions (Signed)

## 2016-01-20 NOTE — Progress Notes (Signed)
Primary Care Physician: Viviana Simpler, MD  Primary Gastroenterologist:  Dr. Lucilla Lame  Chief Complaint  Patient presents with  . follow up Crohn's disease    HPI: Gerald Atkinson is a 55 y.o. male here For follow-up of his Crohn's disease.  The patient states he has been doing well presently.  The patient recently was on meloxicam and states that he was having a lot of abdominal burning while on that medication.  He now reports that it has stopped.  The patient also states that while taking his Pentasa he finds it very inconvenient to take it 4 times a day.  Current Outpatient Prescriptions  Medication Sig Dispense Refill  . APPLE CIDER VINEGAR PO Take by mouth. spoonful    . hyoscyamine (LEVSIN SL) 0.125 MG SL tablet Place 0.125 mg under the tongue every 4 (four) hours as needed.    Marland Kitchen levothyroxine (SYNTHROID, LEVOTHROID) 150 MCG tablet Take 150 mcg by mouth daily. PM    . liothyronine (CYTOMEL) 5 MCG tablet Take 5 mcg by mouth daily.    . mesalamine (PENTASA) 500 MG CR capsule Take 2 capsules (1,000 mg total) by mouth 4 (four) times daily. For 8 weeks 240 capsule 1  . Omega-3 Fatty Acids (FISH OIL CONCENTRATE PO) Take by mouth. Spoonful cod liver oil    . Probiotic Product (PROBIOTIC DAILY PO) Take by mouth.    . ranitidine (ZANTAC) 150 MG capsule Take 150 mg by mouth every other day. am    . meloxicam (MOBIC) 15 MG tablet Take 15 mg by mouth daily.    Marland Kitchen omeprazole (PRILOSEC) 20 MG capsule Take by mouth.     No current facility-administered medications for this visit.     Allergies as of 01/20/2016  . (No Known Allergies)    ROS:  General: Negative for anorexia, weight loss, fever, chills, fatigue, weakness. ENT: Negative for hoarseness, difficulty swallowing , nasal congestion. CV: Negative for chest pain, angina, palpitations, dyspnea on exertion, peripheral edema.  Respiratory: Negative for dyspnea at rest, dyspnea on exertion, cough, sputum, wheezing.  GI: See history  of present illness. GU:  Negative for dysuria, hematuria, urinary incontinence, urinary frequency, nocturnal urination.  Endo: Negative for unusual weight change.    Physical Examination:   BP 108/68   Pulse (!) 57   Temp 98.4 F (36.9 C) (Oral)   Ht 5' 7.5" (1.715 m)   Wt 167 lb (75.8 kg)   BMI 25.77 kg/m   General: Well-nourished, well-developed in no acute distress.  Eyes: No icterus. Conjunctivae pink. Mouth: Oropharyngeal mucosa moist and pink , no lesions erythema or exudate. Lungs: Clear to auscultation bilaterally. Non-labored. Heart: Regular rate and rhythm, no murmurs rubs or gallops.  Abdomen: Bowel sounds are normal, nontender, nondistended, no hepatosplenomegaly or masses, no abdominal bruits or hernia , no rebound or guarding.   Extremities: No lower extremity edema. No clubbing or deformities. Neuro: Alert and oriented x 3.  Grossly intact. Skin: Warm and dry, no jaundice.   Psych: Alert and cooperative, normal mood and affect.  Labs:    Imaging Studies: Dg Inject Diag/thera/inc Needle/cath/plc Epi/lumb/sac W/img  Result Date: 01/20/2016 CLINICAL DATA:  Lumbosacral spondylosis without myelopathy. RIGHT leg radicular symptoms. FLUOROSCOPY TIME:  22 seconds corresponding to a Dose Area Product of 19.99 Gy*m2 PROCEDURE: The procedure, risks, benefits, and alternatives were explained to the patient. Questions regarding the procedure were encouraged and answered. The patient understands and consents to the procedure. Transitional anatomy is present. As  I had difficulty with clear visualization about the dorsal epidural space on the RIGHT at L5-S1, and the disc as seen on 12/09/2015 MR actually projects more to the LEFT than the RIGHT, I elected to go for a RIGHT/ midline approach at L4-5. LUMBAR EPIDURAL INJECTION: An interlaminar approach was performed on RIGHT/midline at L4-5. The overlying skin was cleansed and anesthetized. A 20 gauge epidural needle was advanced using  loss-of-resistance technique. DIAGNOSTIC EPIDURAL INJECTION: Injection of Isovue-M 200 shows a good epidural pattern with spread above and below the level of needle placement, primarily on the RIGHT/midline. No vascular opacification is seen. There was good caudal spread of contrast toward the L5-S1 level. THERAPEUTIC EPIDURAL INJECTION: 120.0 Mg of Depo-Medrol mixed with 2 mL 1% lidocaine were instilled. The procedure was well-tolerated, and the patient was discharged thirty minutes following the injection in good condition. COMPLICATIONS: None. IMPRESSION: Technically successful epidural injection on the RIGHT/midline ata L4-5 # 1, with good caudal spread toward the L5-S1 interspace. Electronically Signed   By: Staci Righter M.D.   On: 01/20/2016 11:25    Assessment and Plan:   Gerald Atkinson is a 55 y.o. y/o male who has a history of Crohn's disease with biopsies of the terminal ileum at his last colonoscopy showing active ileitis.  The patient was on Pentasa and has been taking it up until now but wants to know if he has to stay on it.The patient will have his blood sent off for CRP.  If the CRP is elevated then the patient will need to be on maintenance therapy.  He has been offered possible budesonide.  He is also been told to stay off anti-inflammatory medications with his history of Crohn's disease.  The patient has been explained the plan and agrees with it.   Note: This dictation was prepared with Dragon dictation along with smaller phrase technology. Any transcriptional errors that result from this process are unintentional.

## 2016-01-21 LAB — C-REACTIVE PROTEIN: CRP: 3.2 mg/L (ref 0.0–4.9)

## 2016-01-22 ENCOUNTER — Telehealth: Payer: Self-pay

## 2016-01-22 NOTE — Telephone Encounter (Signed)
Left vm informing pt of his lab results. Advised to contact me with any questions.

## 2016-01-22 NOTE — Telephone Encounter (Signed)
-----   Message from Lucilla Lame, MD sent at 01/22/2016  8:10 AM EDT ----- Let the patient know that his C-reactive protein was normal indicating he is not having a flare of his inflammatory bowel disease.

## 2016-02-04 ENCOUNTER — Other Ambulatory Visit: Payer: Self-pay | Admitting: Student

## 2016-02-04 DIAGNOSIS — M5416 Radiculopathy, lumbar region: Secondary | ICD-10-CM

## 2016-02-17 ENCOUNTER — Ambulatory Visit
Admission: RE | Admit: 2016-02-17 | Discharge: 2016-02-17 | Disposition: A | Discharge status: Home / Self Care | Payer: Managed Care, Other (non HMO) | Source: Ambulatory Visit | Attending: Student | Admitting: Student

## 2016-02-17 DIAGNOSIS — M5416 Radiculopathy, lumbar region: Secondary | ICD-10-CM

## 2016-02-17 MED ORDER — IOPAMIDOL (ISOVUE-M 200) INJECTION 41%
1.0000 mL | Freq: Once | INTRAMUSCULAR | Status: AC
  Administered 2016-02-17: 1 mL via EPIDURAL

## 2016-02-17 MED ORDER — METHYLPREDNISOLONE ACETATE 40 MG/ML INJ SUSP (RADIOLOG
120.0000 mg | Freq: Once | INTRAMUSCULAR | Status: AC
  Administered 2016-02-17: 120 mg via EPIDURAL

## 2016-02-17 NOTE — Discharge Instructions (Signed)

## 2016-09-05 IMAGING — XA Imaging study
1 series · 1 of 1 positions shown · non-contrast
Comparison: none

CLINICAL DATA: Lumbosacral spondylosis without myelopathy. RIGHT
leg radicular symptoms.

[Series 1: ortho standard · 1 of 1 slices shown]
[im 1/1]
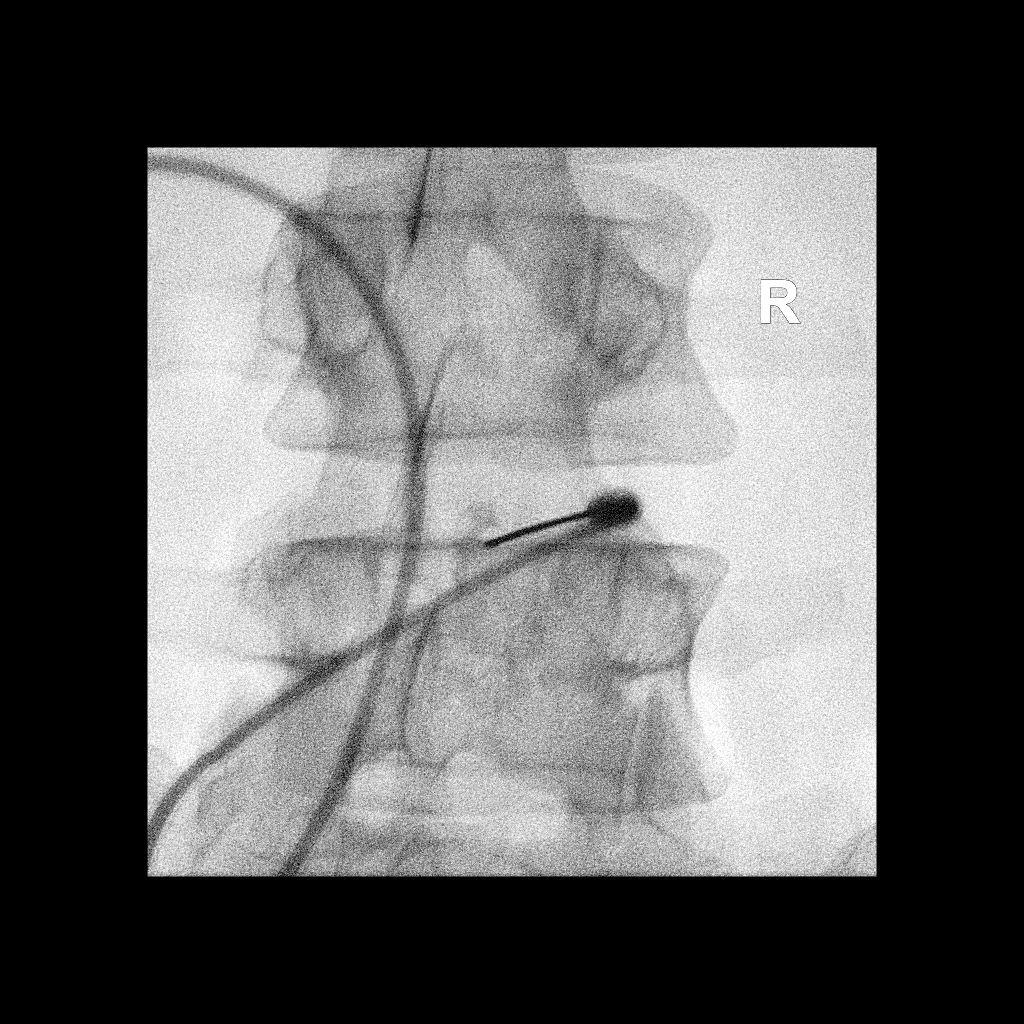

[1 of 1 positions shown; findings below may reference images not displayed]

FLUOROSCOPY TIME:  22 seconds corresponding to a Dose Area Product
of 19.99 ?Gy*m2

PROCEDURE:
The procedure, risks, benefits, and alternatives were explained to
the patient. Questions regarding the procedure were encouraged and
answered. The patient understands and consents to the procedure.
Transitional anatomy is present. As I had difficulty with clear
visualization about the dorsal epidural space on the RIGHT at L5-S1,
and the disc as seen on 12/09/2015 MR actually projects more to the
LEFT than the RIGHT, I elected to go for a RIGHT/ midline approach
at L4-5.

LUMBAR EPIDURAL INJECTION:

An interlaminar approach was performed on RIGHT/midline at L4-5. The
overlying skin was cleansed and anesthetized. A 20 gauge epidural
needle was advanced using loss-of-resistance technique.

DIAGNOSTIC EPIDURAL INJECTION:

Injection of Isovue-M 200 shows a good epidural pattern with spread
above and below the level of needle placement, primarily on the
RIGHT/midline. No vascular opacification is seen. There was good
caudal spread of contrast toward the L5-S1 level.

THERAPEUTIC EPIDURAL INJECTION:

120.0 Mg of Depo-Medrol mixed with 2 mL 1% lidocaine were instilled.
The procedure was well-tolerated, and the patient was discharged
thirty minutes following the injection in good condition.

COMPLICATIONS:
None.
IMPRESSION: Technically successful epidural injection on the RIGHT/midline ata
L4-5 # 1, with good caudal spread toward the L5-S1 interspace.

## 2018-01-18 ENCOUNTER — Other Ambulatory Visit: Payer: Self-pay | Admitting: Orthopedic Surgery

## 2018-01-18 DIAGNOSIS — M25461 Effusion, right knee: Secondary | ICD-10-CM

## 2018-01-18 DIAGNOSIS — G8929 Other chronic pain: Secondary | ICD-10-CM

## 2018-01-18 DIAGNOSIS — M25561 Pain in right knee: Secondary | ICD-10-CM

## 2018-01-18 DIAGNOSIS — M25361 Other instability, right knee: Secondary | ICD-10-CM

## 2018-01-18 DIAGNOSIS — M2391 Unspecified internal derangement of right knee: Secondary | ICD-10-CM

## 2018-09-05 ENCOUNTER — Emergency Department: Payer: BLUE CROSS/BLUE SHIELD

## 2018-09-05 ENCOUNTER — Other Ambulatory Visit: Payer: Self-pay

## 2018-09-05 ENCOUNTER — Emergency Department
Admission: EM | Admit: 2018-09-05 | Discharge: 2018-09-05 | Disposition: A | Discharge status: Home / Self Care | Payer: BLUE CROSS/BLUE SHIELD | Attending: Emergency Medicine | Admitting: Emergency Medicine

## 2018-09-05 DIAGNOSIS — R0602 Shortness of breath: Secondary | ICD-10-CM | POA: Insufficient documentation

## 2018-09-05 DIAGNOSIS — Z20828 Contact with and (suspected) exposure to other viral communicable diseases: Secondary | ICD-10-CM | POA: Diagnosis not present

## 2018-09-05 DIAGNOSIS — Z8546 Personal history of malignant neoplasm of prostate: Secondary | ICD-10-CM | POA: Insufficient documentation

## 2018-09-05 DIAGNOSIS — J029 Acute pharyngitis, unspecified: Secondary | ICD-10-CM

## 2018-09-05 DIAGNOSIS — E039 Hypothyroidism, unspecified: Secondary | ICD-10-CM | POA: Insufficient documentation

## 2018-09-05 DIAGNOSIS — R0789 Other chest pain: Secondary | ICD-10-CM | POA: Insufficient documentation

## 2018-09-05 DIAGNOSIS — R070 Pain in throat: Secondary | ICD-10-CM | POA: Diagnosis not present

## 2018-09-05 LAB — COMPREHENSIVE METABOLIC PANEL
ALT: 16 U/L (ref 0–44)
AST: 19 U/L (ref 15–41)
Albumin: 4.2 g/dL (ref 3.5–5.0)
Alkaline Phosphatase: 54 U/L (ref 38–126)
Anion gap: 10 (ref 5–15)
BUN: 24 mg/dL — ABNORMAL HIGH (ref 6–20)
CO2: 25 mmol/L (ref 22–32)
Calcium: 9.1 mg/dL (ref 8.9–10.3)
Chloride: 104 mmol/L (ref 98–111)
Creatinine, Ser: 1.11 mg/dL (ref 0.61–1.24)
GFR calc Af Amer: >60 mL/min (ref >60)
GFR calc non Af Amer: >60 mL/min (ref >60)
Glucose, Bld: 99 mg/dL (ref 70–99)
Potassium: 3.7 mmol/L (ref 3.5–5.1)
Sodium: 139 mmol/L (ref 135–145)
Total Bilirubin: 0.6 mg/dL (ref 0.3–1.2)
Total Protein: 7.4 g/dL (ref 6.5–8.1)

## 2018-09-05 LAB — CBC WITH DIFFERENTIAL/PLATELET
Abs Immature Granulocytes: 0.02 10*3/uL (ref 0.00–0.07)
Basophils Absolute: 0.1 10*3/uL (ref 0.0–0.1)
Basophils Relative: 1 %
Eosinophils Absolute: 0.1 10*3/uL (ref 0.0–0.5)
Eosinophils Relative: 1 %
HCT: 42.4 % (ref 39.0–52.0)
Hemoglobin: 14.4 g/dL (ref 13.0–17.0)
Immature Granulocytes: 0 %
Lymphocytes Relative: 21 %
Lymphs Abs: 2.1 10*3/uL (ref 0.7–4.0)
MCH: 29.6 pg (ref 26.0–34.0)
MCHC: 34 g/dL (ref 30.0–36.0)
MCV: 87.1 fL (ref 80.0–100.0)
Monocytes Absolute: 0.7 10*3/uL (ref 0.1–1.0)
Monocytes Relative: 7 %
Neutro Abs: 7.1 10*3/uL (ref 1.7–7.7)
Neutrophils Relative %: 70 %
Platelets: 298 10*3/uL (ref 150–400)
RBC: 4.87 MIL/uL (ref 4.22–5.81)
RDW: 12.4 % (ref 11.5–15.5)
WBC: 10.1 10*3/uL (ref 4.0–10.5)
nRBC: 0 % (ref 0.0–0.2)

## 2018-09-05 LAB — TROPONIN I: Troponin I: <0.03 ng/mL (ref <0.03)

## 2018-09-05 NOTE — ED Triage Notes (Signed)
Pt arrives to ED triage tent from home via POV with c/c of "chest tightness" and SoB x3 days. 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 Pt denies cough, sore throat. Pt A&Ox4, NAD. Hx of [place]

## 2018-09-05 NOTE — ED Provider Notes (Signed)
[facility] Emergency Department Provider Note  ____________________________________________  Time seen: Approximately 5:20 PM  I have reviewed the triage vital signs and the nursing notes.   HISTORY  Chief Complaint Chest Pain    HPI Gerald Atkinson is a 58 y.o. male history of hypothyroidism and Crohn's disease presenting for chest tightness and shortness of breath.  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  He is also concerned because his secretary's husband has symptoms that are consistent with coronavirus, but has not met criteria for testing.  Over the past several days, he has noted a central chest tightness associated with shortness of breath.  Chest tightness is worse if he takes a deep breath.  He has had a scratchy throat.  No congestion or rhinorrhea, cough, fever.  He is wondering if some of his symptoms may be due to pollen.  No hx of CAD, or HTN, HL or DM.  Past Medical History:  Diagnosis Date  . Crohn disease ([place])   . GERD (gastroesophageal reflux disease)   . Hypothyroidism   . IBS (irritable bowel syndrome)   . Multinodular goiter    with hypothyroidism after RAI  . PONV (postoperative nausea and vomiting)   . Prostate cancer [facility])     Patient Active Problem List   Diagnosis Date Noted  . Spondylosis of lumbar region without myelopathy or radiculopathy 12/17/2015  . Superior glenoid labrum lesion of right shoulder 12/17/2015  . Crohn's disease of small intestine without complication ([place])   . First degree hemorrhoids   . Ulceration of intestine   . Routine general medical examination at a health care facility 12/11/2013  . Crohn disease ([place])   . IBS (irritable bowel syndrome)   . GERD (gastroesophageal reflux disease)   . Prostate cancer ([place])   . Hypothyroidism, postablative     Past Surgical History:  Procedure Laterality Date  . APPENDECTOMY    . COLONOSCOPY WITH PROPOFOL  N/A 10/14/2015   Procedure: COLONOSCOPY WITH PROPOFOL;  Surgeon: Lucilla Lame, MD;  Location: [facility];  Service: Endoscopy;  Laterality: N/A;  . KNEE ARTHROSCOPY W/ MENISCAL REPAIR Bilateral ~2000 &2009   Dr Leanor Kail  . ROBOT ASSISTED LAPAROSCOPIC RADICAL PROSTATECTOMY  2012    Current Outpatient Rx  . Order #: [id number] Class: Historical Med  . Order #: [id number] Class: Historical Med  . Order #: [id number] Class: Historical Med  . Order #: [id number] Class: Historical Med  . Order #: [id number] Class: Historical Med  . Order #: [id number] Class: Normal  . Order #: [id number] Class: Historical Med  . Order #: [id number] Class: Historical Med  . Order #: [id number] Class: Historical Med  . Order #: [id number] Class: Historical Med    Allergies Patient has no known allergies.  Family History  Problem Relation Age of Onset  . Cancer Father        bladder  . Diabetes Neg Hx   . Heart disease Neg Hx     Social History Social History   Tobacco Use  . Smoking status: Never Smoker  . Smokeless tobacco: Never Used  Substance Use Topics  . Alcohol use: Yes    Alcohol/week: 3.0 standard drinks    Types: 3 Cans of beer per week  . Drug use: No    Review of Systems Constitutional: No fever/chills.  No lightheadedness or syncope. Eyes: No visual changes.  No eye discharge. ENT: Positive scratchy throat. No congestion or rhinorrhea. Cardiovascular: Positive central chest tightness. Denies  palpitations. Respiratory: Positive shortness of breath.  No cough. Gastrointestinal: No abdominal pain.  No nausea, no vomiting.  No diarrhea.  No constipation. Genitourinary: Negative for dysuria. Musculoskeletal: Negative for back pain.  No lower extremity swelling or calf pain. Skin: Negative for rash. Neurological: Negative for headaches. No focal numbness, tingling or weakness.     ____________________________________________   PHYSICAL EXAM:  VITAL SIGNS: ED Triage Vitals  Enc Vitals Group      BP 09/05/18 1629 119/84     Pulse Rate 09/05/18 1629 70     Resp --      Temp 09/05/18 1629 97.8 F (36.6 C)     Temp Source 09/05/18 1629 Oral     SpO2 09/05/18 1629 100 %     Weight 09/05/18 1630 170 lb (77.1 kg)     Height 09/05/18 1630 '5\' 7"'  (1.702 m)     Head Circumference --      Peak Flow --      Pain Score 09/05/18 1629 0     Pain Loc --      Pain Edu? --      Excl. in [place]? --     Constitutional: Alert and oriented. Answers questions appropriately. Eyes: Conjunctivae are normal.  EOMI. No scleral icterus.  No eye discharge. Head: Atraumatic. Nose: No congestion/rhinnorhea. Neck: No stridor.  Supple.  No JVD.  No meningismus. Cardiovascular: Normal rate, regular rhythm. No murmurs, rubs or gallops.  Respiratory: Normal respiratory effort.  No accessory muscle use or retractions. Lungs CTAB.  No wheezes, rales or ronchi. Musculoskeletal: No LE edema. No ttp in the calves or palpable cords.  Negative Homan's sign. Neurologic:  A&Ox3.  Speech is clear.  Face and smile are symmetric.  EOMI.  Moves all extremities well. Skin:  Skin is warm, dry and intact. No rash noted. Psychiatric: Mood and affect are normal. Speech and behavior are normal.  Normal judgement.  ____________________________________________   LABS (all labs ordered are listed, but only abnormal results are displayed)  Labs Reviewed  CBC WITH DIFFERENTIAL/PLATELET  COMPREHENSIVE METABOLIC PANEL  TROPONIN I   ____________________________________________  EKG  ED ECG REPORT I, Anne-Caroline Mariea Clonts, the attending physician, personally viewed and interpreted this ECG.   Date: 09/05/2018  EKG Time: 1725  Rate: 68  Rhythm: normal sinus rhythm  Axis: leftward  Intervals:none  ST&T Change: No STEMI  ____________________________________________  RADIOLOGY  No results found.  ____________________________________________   PROCEDURES  Procedure(s) performed: None  Procedures  Critical  Care performed: No ____________________________________________   INITIAL IMPRESSION / ASSESSMENT AND PLAN / ED COURSE  Pertinent labs & imaging results that were available during my care of the patient were reviewed by me and considered in my medical decision making (see chart for details).  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  Overall, the patient is hemodynamically stable and afebrile.  His oxygen saturation is 100% on room air.  Will do an ambulatory pulse ox.  The patient may have signs or symptoms of coronavirus, but he does not meet criteria for testing at this time.  Chest x-ray has been ordered and will order basic laboratory studies.  Other possible etiologies include ischemia, ACS or MI and a screening EKG with troponin have also been ordered.  The patient has no significant CAD risk factors so this is again less likely.  Certainly, seasonal allergies could be causing the patient's symptoms, but he states he is  not usually symptomatic to pollen.  I have discussed with the patient that given his symptoms, he should remain out of work for at least 14 days.  Plan reevaluation for final disposition.  Gerald Atkinson was evaluated in Emergency Department on 09/05/2018 for the symptoms described in the history of present illness. He was evaluated in the context of the global COVID-19 pandemic, which necessitated consideration that the patient might be at risk for infection with the SARS-CoV-2 virus that causes COVID-19. Institutional protocols and algorithms that pertain to the evaluation of patients at risk for COVID-19 are in a state of rapid change based on information released by regulatory bodies including the CDC and federal and state organizations. These policies and algorithms were followed during the patient's care in the ED.  ----------------------------------------- 5:38 PM on  09/05/2018 -----------------------------------------  Since chest x-ray is inconclusive as there is an abnormality in the left mid chest that could either be pneumonia or asymmetric positioning of the patient's arm.  The patient would like to be discharged because he has personal matters to attend to but I have let him know these abnormalities and he is willing to stay for x-ray.  Given his exposure history and symptoms, he will have a repeat portable chest x-ray, and not the PA and lateral that are recommended as the risk-benefit ratio for exposure to our healthcare system does not validate sending him to radiology.  ----------------------------------------- 6:46 PM on 09/05/2018 -----------------------------------------  The patient's work-up in the emergency department has been reassuring.  He has reassuring blood counts, electrolytes, his troponin is negative his EKG does not show ischemia or arrhythmia.  His repeat chest x-ray is reassuring without any evidence of pneumonia.  At this time, the patient will be safe for discharge home.  Follow-up instructions as well as return precautions were discussed.  ____________________________________________  FINAL CLINICAL IMPRESSION(S) / ED DIAGNOSES  Final diagnoses:  Chest tightness  Shortness of breath  Sore throat         NEW MEDICATIONS STARTED DURING THIS VISIT:  New Prescriptions   No medications on file      Eula Listen, MD 09/05/18 1857

## 2018-09-05 NOTE — ED Notes (Signed)
Pt refused discharge vitals 

## 2018-09-05 NOTE — ED Notes (Signed)
Pt was ambulated around the room several times without feeling SHOB, having chest pain, and O2 sat remained 98% on RA

## 2018-09-05 NOTE — ED Notes (Addendum)
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

## 2018-09-05 NOTE — Discharge Instructions (Signed)
Please return to the emergency department if you develop worsening shortness of breath, lightheadedness or fainting, severe cough or fever, or any other symptoms concerning to you.     Person Under Monitoring Name: Gerald Atkinson  Location: [place] [address]   Infection Prevention Recommendations for Individuals Confirmed to have, or Being Evaluated for, 2019 Novel Coronavirus (COVID-19) Infection Who Receive Care at Home  Individuals who are confirmed to have, or are being evaluated for, COVID-19 should follow the prevention steps below until a healthcare provider or local or state health department says they can return to normal activities.  Stay home except to get medical care You should restrict activities outside your home, except for getting medical care. Do not go to work, school, or public areas, and do not use public transportation or taxis.  Call ahead before visiting your doctor Before your medical appointment, call the healthcare provider and tell them that you have, or are being evaluated for, COVID-19 infection. This will help the healthcare providers office take steps to keep other people from getting infected. Ask your healthcare provider to call the local or state health department.  Monitor your symptoms Seek prompt medical attention if your illness is worsening (e.g., difficulty breathing). Before going to your medical appointment, call the healthcare provider and tell them that you have, or are being evaluated for, COVID-19 infection. Ask your healthcare provider to call the local or state health department.  Wear a facemask You should wear a facemask that covers your nose and mouth when you are in the same room with other people and when you visit a healthcare provider. People who live with or visit you should also wear a facemask while they are in the same room with you.  Separate yourself from other people in your home As much as  possible, you should stay in a different room from other people in your home. Also, you should use a separate bathroom, if available.  Avoid sharing household items You should not share dishes, drinking glasses, cups, eating utensils, towels, bedding, or other items with other people in your home. After using these items, you should wash them thoroughly with soap and water.  Cover your coughs and sneezes Cover your mouth and nose with a tissue when you cough or sneeze, or you can cough or sneeze into your sleeve. Throw used tissues in a lined trash can, and immediately wash your hands with soap and water for at least 20 seconds or use an alcohol-based hand rub.  Wash your [facility] your hands often and thoroughly with soap and water for at least 20 seconds. You can use an alcohol-based hand sanitizer if soap and water are not available and if your hands are not visibly dirty. Avoid touching your eyes, nose, and mouth with unwashed hands.   Prevention Steps for Caregivers and Household Members of Individuals Confirmed to have, or Being Evaluated for, COVID-19 Infection Being Cared for in the Home  If you live with, or provide care at home for, a person confirmed to have, or being evaluated for, COVID-19 infection please follow these guidelines to prevent infection:  Follow healthcare providers instructions Make sure that you understand and can help the patient follow any healthcare provider instructions for all care.  Provide for the patients basic needs You should help the patient with basic needs in the home and provide support for getting groceries, prescriptions, and other personal needs.  Monitor the patients symptoms If they  are getting sicker, call his or her medical provider and tell them that the patient has, or is being evaluated for, COVID-19 infection. This will help the healthcare providers office take steps to keep other people from getting infected. Ask the  healthcare provider to call the local or state health department.  Limit the number of people who have contact with the patient If possible, have only one caregiver for the patient. Other household members should stay in another home or place of residence. If this is not possible, they should stay in another room, or be separated from the patient as much as possible. Use a separate bathroom, if available. Restrict visitors who do not have an essential need to be in the home.  Keep older adults, very young children, and other sick people away from the patient Keep older adults, very young children, and those who have compromised immune systems or chronic health conditions away from the patient. This includes people with chronic heart, lung, or kidney conditions, diabetes, and cancer.  Ensure good ventilation Make sure that shared spaces in the home have good air flow, such as from an air conditioner or an opened window, weather permitting.  Wash your hands often Wash your hands often and thoroughly with soap and water for at least 20 seconds. You can use an alcohol based hand sanitizer if soap and water are not available and if your hands are not visibly dirty. Avoid touching your eyes, nose, and mouth with unwashed hands. Use disposable paper towels to dry your hands. If not available, use dedicated cloth towels and replace them when they become wet.  Wear a facemask and gloves Wear a disposable facemask at all times in the room and gloves when you touch or have contact with the patients blood, body fluids, and/or secretions or excretions, such as sweat, saliva, sputum, nasal mucus, vomit, urine, or feces.  Ensure the mask fits over your nose and mouth tightly, and do not touch it during use. Throw out disposable facemasks and gloves after using them. Do not reuse. Wash your hands immediately after removing your facemask and gloves. If your personal clothing becomes contaminated, carefully  remove clothing and launder. Wash your hands after handling contaminated clothing. Place all used disposable facemasks, gloves, and other waste in a lined container before disposing them with other household waste. Remove gloves and wash your hands immediately after handling these items.  Do not share dishes, glasses, or other household items with the patient Avoid sharing household items. You should not share dishes, drinking glasses, cups, eating utensils, towels, bedding, or other items with a patient who is confirmed to have, or being evaluated for, COVID-19 infection. After the person uses these items, you should wash them thoroughly with soap and water.  Wash laundry thoroughly Immediately remove and wash clothes or bedding that have blood, body fluids, and/or secretions or excretions, such as sweat, saliva, sputum, nasal mucus, vomit, urine, or feces, on them. Wear gloves when handling laundry from the patient. Read and follow directions on labels of laundry or clothing items and detergent. In general, wash and dry with the warmest temperatures recommended on the label.  Clean all areas the individual has used often Clean all touchable surfaces, such as counters, tabletops, doorknobs, bathroom fixtures, toilets, phones, keyboards, tablets, and bedside tables, every day. Also, clean any surfaces that may have blood, body fluids, and/or secretions or excretions on them. Wear gloves when cleaning surfaces the patient has come in contact with. Use a  diluted bleach solution (e.g., dilute bleach with 1 part bleach and 10 parts water) or a household disinfectant with a label that says EPA-registered for coronaviruses. To make a bleach solution at home, add 1 tablespoon of bleach to 1 quart (4 cups) of water. For a larger supply, add  cup of bleach to 1 gallon (16 cups) of water. Read labels of cleaning products and follow recommendations provided on product labels. Labels contain instructions for  safe and effective use of the cleaning product including precautions you should take when applying the product, such as wearing gloves or eye protection and making sure you have good ventilation during use of the product. Remove gloves and wash hands immediately after cleaning.  Monitor yourself for signs and symptoms of illness Caregivers and household members are considered close contacts, should monitor their health, and will be asked to limit movement outside of the home to the extent possible. Follow the monitoring steps for close contacts listed on the symptom monitoring form.   ? If you have additional questions, contact your local health department or call the epidemiologist on call at [phone] (available 24/7). ? This guidance is subject to change. For the most up-to-date guidance from [facility], please refer to their website: [email or web address]

## 2018-09-05 NOTE — ED Notes (Signed)
Pt requesting to leave to handle some personal business - Dr Mariea Clonts notified and she is talking to pt via ascom - pt has agreed to stay for repeat CXR

## 2018-09-19 DIAGNOSIS — Z8546 Personal history of malignant neoplasm of prostate: Secondary | ICD-10-CM | POA: Diagnosis not present

## 2018-09-19 DIAGNOSIS — E890 Postprocedural hypothyroidism: Secondary | ICD-10-CM | POA: Diagnosis not present

## 2018-09-26 DIAGNOSIS — Z8546 Personal history of malignant neoplasm of prostate: Secondary | ICD-10-CM | POA: Diagnosis not present

## 2018-09-26 DIAGNOSIS — E890 Postprocedural hypothyroidism: Secondary | ICD-10-CM | POA: Diagnosis not present

## 2018-10-19 DIAGNOSIS — Z03818 Encounter for observation for suspected exposure to other biological agents ruled out: Secondary | ICD-10-CM | POA: Diagnosis not present

## 2018-12-19 DIAGNOSIS — D2262 Melanocytic nevi of left upper limb, including shoulder: Secondary | ICD-10-CM | POA: Diagnosis not present

## 2018-12-19 DIAGNOSIS — X32XXXA Exposure to sunlight, initial encounter: Secondary | ICD-10-CM | POA: Diagnosis not present

## 2018-12-19 DIAGNOSIS — L570 Actinic keratosis: Secondary | ICD-10-CM | POA: Diagnosis not present

## 2018-12-19 DIAGNOSIS — D2261 Melanocytic nevi of right upper limb, including shoulder: Secondary | ICD-10-CM | POA: Diagnosis not present

## 2018-12-19 DIAGNOSIS — Z85828 Personal history of other malignant neoplasm of skin: Secondary | ICD-10-CM | POA: Diagnosis not present

## 2018-12-19 DIAGNOSIS — D2272 Melanocytic nevi of left lower limb, including hip: Secondary | ICD-10-CM | POA: Diagnosis not present

## 2019-04-03 DIAGNOSIS — Z8546 Personal history of malignant neoplasm of prostate: Secondary | ICD-10-CM | POA: Diagnosis not present

## 2019-04-03 DIAGNOSIS — E890 Postprocedural hypothyroidism: Secondary | ICD-10-CM | POA: Diagnosis not present

## 2019-04-10 DIAGNOSIS — Z8546 Personal history of malignant neoplasm of prostate: Secondary | ICD-10-CM | POA: Diagnosis not present

## 2019-04-10 DIAGNOSIS — E890 Postprocedural hypothyroidism: Secondary | ICD-10-CM | POA: Diagnosis not present

## 2019-11-13 DIAGNOSIS — D225 Melanocytic nevi of trunk: Secondary | ICD-10-CM | POA: Diagnosis not present

## 2019-11-13 DIAGNOSIS — D485 Neoplasm of uncertain behavior of skin: Secondary | ICD-10-CM | POA: Diagnosis not present

## 2019-11-13 DIAGNOSIS — D2262 Melanocytic nevi of left upper limb, including shoulder: Secondary | ICD-10-CM | POA: Diagnosis not present

## 2019-11-13 DIAGNOSIS — D2261 Melanocytic nevi of right upper limb, including shoulder: Secondary | ICD-10-CM | POA: Diagnosis not present

## 2019-11-13 DIAGNOSIS — Z85828 Personal history of other malignant neoplasm of skin: Secondary | ICD-10-CM | POA: Diagnosis not present

## 2020-03-25 DIAGNOSIS — Z8546 Personal history of malignant neoplasm of prostate: Secondary | ICD-10-CM | POA: Diagnosis not present

## 2020-03-25 DIAGNOSIS — E890 Postprocedural hypothyroidism: Secondary | ICD-10-CM | POA: Diagnosis not present

## 2020-04-01 DIAGNOSIS — Z8546 Personal history of malignant neoplasm of prostate: Secondary | ICD-10-CM | POA: Diagnosis not present

## 2020-04-01 DIAGNOSIS — E890 Postprocedural hypothyroidism: Secondary | ICD-10-CM | POA: Diagnosis not present

## 2020-04-01 DIAGNOSIS — E785 Hyperlipidemia, unspecified: Secondary | ICD-10-CM | POA: Diagnosis not present

## 2020-07-22 ENCOUNTER — Telehealth: Payer: Self-pay

## 2020-07-22 ENCOUNTER — Ambulatory Visit (INDEPENDENT_AMBULATORY_CARE_PROVIDER_SITE_OTHER): Payer: BLUE CROSS/BLUE SHIELD | Admitting: Internal Medicine

## 2020-07-22 ENCOUNTER — Encounter: Payer: Self-pay | Admitting: Internal Medicine

## 2020-07-22 ENCOUNTER — Other Ambulatory Visit: Payer: Self-pay

## 2020-07-22 VITALS — BP 100/78 | HR 60 | Temp 97.7°F | Ht 67.0 in | Wt 175.0 lb

## 2020-07-22 DIAGNOSIS — Z23 Encounter for immunization: Secondary | ICD-10-CM

## 2020-07-22 DIAGNOSIS — E890 Postprocedural hypothyroidism: Secondary | ICD-10-CM | POA: Diagnosis not present

## 2020-07-22 DIAGNOSIS — K5000 Crohn's disease of small intestine without complications: Secondary | ICD-10-CM

## 2020-07-22 DIAGNOSIS — Z0000 Encounter for general adult medical examination without abnormal findings: Secondary | ICD-10-CM | POA: Diagnosis not present

## 2020-07-22 DIAGNOSIS — Z8546 Personal history of malignant neoplasm of prostate: Secondary | ICD-10-CM

## 2020-07-22 NOTE — Assessment & Plan Note (Signed)
Healthy  Working on fitness Tdap today Yearly flu vaccine Does need COVID booster

## 2020-07-22 NOTE — Telephone Encounter (Signed)
[facility] Night - Client Nonclinical Telephone Record  [facility] Client [facility] Night - Client Client Site [place] Physician Viviana Simpler- MD Contact Type Call Who Is Calling Patient / Member / Family / Caregiver Caller Name [place] Phone Number [phone] Patient Name Gerald Atkinson Patient DOB 02-02-1961 Call Type Message Only Information Provided Reason for Call Request for General Office Information Initial Comment Caller is confirming his 330 appointment for tomorrow. Additional Comment Office hours provided. Disp. Time Disposition Final User 07/20/2020 10:47:06 AM General Information Provided Yes Melanee Spry Call Closed By: Melanee Spry Transaction Date/Time: 07/20/2020 10:45:51 AM (ET)

## 2020-07-22 NOTE — Assessment & Plan Note (Signed)
Gets labs at [facility]--it looks good

## 2020-07-22 NOTE — Assessment & Plan Note (Signed)
Last PSA very low No action on this

## 2020-07-22 NOTE — Addendum Note (Signed)
Addended by: Pilar Grammes on: 07/22/2020 04:52 PM   Modules accepted: Orders

## 2020-07-22 NOTE — Progress Notes (Signed)
Subjective:    Patient ID: Gerald Atkinson, male    DOB: December 25, 1960, 60 y.o.   MRN: [id number]  HPI Here for physical This visit occurred during the SARS-CoV-2 public health emergency.  Safety protocols were in place, including screening questions prior to the visit, additional usage of staff PPE, and extensive cleaning of exam room while observing appropriate contact time as indicated for disinfecting solutions.   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 APPLE CIDER VINEGAR PO Take by mouth. spoonful    . famotidine (PEPCID) 10 MG tablet Take 10 mg by mouth 2 (two) times daily.    . hyoscyamine (LEVSIN SL) 0.125 MG SL tablet Place 0.125 mg under the tongue every 4 (four) hours as needed.    Marland Kitchen levothyroxine (SYNTHROID, LEVOTHROID) 150 MCG tablet Take 150 mcg by mouth daily. PM    . liothyronine (CYTOMEL) 5 MCG tablet Take 5 mcg by mouth daily.    . Multiple Vitamin (MULTIVITAMIN) tablet Take 1 tablet by mouth daily.    . Omega-3 Fatty Acids (FISH OIL CONCENTRATE PO) Take by mouth. Spoonful cod liver oil    . Probiotic Product (PROBIOTIC DAILY PO) Take by mouth.    Marland Kitchen VITAMIN D, CHOLECALCIFEROL, PO Take by mouth.     No current facility-administered medications on file prior to visit.    No Known Allergies  Past Medical History:  Diagnosis Date  . Crohn disease ([place])   . GERD (gastroesophageal reflux disease)   . Hypothyroidism   . IBS (irritable bowel syndrome)   . Multinodular goiter    with hypothyroidism after RAI  . PONV  (postoperative nausea and vomiting)   . Prostate cancer [facility])     Past Surgical History:  Procedure Laterality Date  . APPENDECTOMY    . COLONOSCOPY WITH PROPOFOL N/A 10/14/2015   Procedure: COLONOSCOPY WITH PROPOFOL;  Surgeon: Lucilla Lame, MD;  Location: [facility];  Service: Endoscopy;  Laterality: N/A;  . KNEE ARTHROSCOPY W/ MENISCAL REPAIR Bilateral ~2000 &2009   Dr Leanor Kail  . ROBOT ASSISTED LAPAROSCOPIC RADICAL PROSTATECTOMY  2012    Family History  Problem Relation Age of Onset  . Cancer Father        bladder  . Diabetes Neg Hx   . Heart disease Neg Hx     Social History   Socioeconomic History  . Marital status: Divorced    Spouse name: Not on file  . Number of children: 2  . Years of education: Not on file  . Highest education level: Not on file  Occupational History  . Occupation: Pharmacist, community  Tobacco Use  . Smoking status: Never Smoker  . Smokeless tobacco: Never Used  Substance and Sexual Activity  . Alcohol use: Yes    Alcohol/week: 3.0 standard drinks    Types: 3 Cans of beer per week  . Drug use: No  . Sexual activity: Not on file  Other Topics Concern  . Not on file  Social History Narrative  . Not on file   Social Determinants of Health   Financial Resource  Strain: Not on file  Food Insecurity: Not on file  Transportation Needs: Not on file  Physical Activity: Not on file  Stress: Not on file  Social Connections: Not on file  Intimate Partner Violence: Not on file   Review of Systems  Constitutional:       Weight up a little over the years Wears seat belt  HENT: Positive for tinnitus. Negative for dental problem and trouble swallowing.        Mild hearing loss?   Eyes: Negative for visual disturbance.       No diplopia or unilateral vision loss  Respiratory: Negative for cough, chest tightness and shortness of breath.   Cardiovascular: Positive for chest pain. Negative for palpitations and leg swelling.  Gastrointestinal:  Negative for blood in stool, constipation and diarrhea.       Some heartburn--uses famotidine bid with good results  Endocrine: Negative for polyuria.  Genitourinary: Negative for difficulty urinating and urgency.  Musculoskeletal: Positive for arthralgias and back pain. Negative for joint swelling.  Skin: Negative for rash.       No suspicious skin lesions--does see Dr Evorn Gong Did have [facility]  Allergic/Immunologic: Positive for environmental allergies. Negative for immunocompromised state.       Mild allergies---doesn't really need meds  Neurological: Negative for dizziness, syncope, light-headedness and headaches.  Hematological: Negative for adenopathy. Does not bruise/bleed easily.  Psychiatric/Behavioral: Negative for dysphoric mood and sleep disturbance. The patient is not nervous/anxious.        Not a great sleeper---tosses and turns. Some fatigue at first in AM Some snoring but no apnea       Objective:   Physical Exam Constitutional:      Appearance: Normal appearance.  HENT:     Right Ear: Tympanic membrane, ear canal and external ear normal.     Left Ear: Tympanic membrane, ear canal and external ear normal.     Mouth/Throat:     Pharynx: No oropharyngeal exudate or posterior oropharyngeal erythema.  Eyes:     Conjunctiva/sclera: Conjunctivae normal.     Pupils: Pupils are equal, round, and reactive to light.  Cardiovascular:     Rate and Rhythm: Normal rate and regular rhythm.     Pulses: Normal pulses.     Heart sounds: No murmur heard. No gallop.   Pulmonary:     Effort: Pulmonary effort is normal.     Breath sounds: Normal breath sounds. No wheezing or rales.  Abdominal:     Palpations: Abdomen is soft.     Tenderness: There is no abdominal tenderness.  Musculoskeletal:     Cervical back: Neck supple.     Right lower leg: No edema.     Left lower leg: No edema.  Lymphadenopathy:     Cervical: No cervical adenopathy.  Skin:    General: Skin is warm.      Findings: No rash.  Neurological:     General: No focal deficit present.     Mental Status: He is alert and oriented to person, place, and time.  Psychiatric:        Mood and Affect: Mood normal.        Behavior: Behavior normal.            Assessment & Plan:

## 2020-07-22 NOTE — Assessment & Plan Note (Signed)
Quiet now Next colon per GI---Dr Allen Norris

## 2020-08-07 DIAGNOSIS — F900 Attention-deficit hyperactivity disorder, predominantly inattentive type: Secondary | ICD-10-CM | POA: Diagnosis not present

## 2020-10-14 DIAGNOSIS — E785 Hyperlipidemia, unspecified: Secondary | ICD-10-CM | POA: Diagnosis not present

## 2020-10-14 DIAGNOSIS — E890 Postprocedural hypothyroidism: Secondary | ICD-10-CM | POA: Diagnosis not present

## 2020-10-14 DIAGNOSIS — Z8546 Personal history of malignant neoplasm of prostate: Secondary | ICD-10-CM | POA: Diagnosis not present

## 2020-10-14 DIAGNOSIS — Z9229 Personal history of other drug therapy: Secondary | ICD-10-CM | POA: Diagnosis not present

## 2020-10-21 DIAGNOSIS — E890 Postprocedural hypothyroidism: Secondary | ICD-10-CM | POA: Diagnosis not present

## 2020-10-21 DIAGNOSIS — Z8546 Personal history of malignant neoplasm of prostate: Secondary | ICD-10-CM | POA: Diagnosis not present

## 2020-11-11 DIAGNOSIS — R208 Other disturbances of skin sensation: Secondary | ICD-10-CM | POA: Diagnosis not present

## 2020-11-11 DIAGNOSIS — D485 Neoplasm of uncertain behavior of skin: Secondary | ICD-10-CM | POA: Diagnosis not present

## 2020-11-11 DIAGNOSIS — L538 Other specified erythematous conditions: Secondary | ICD-10-CM | POA: Diagnosis not present

## 2020-11-11 DIAGNOSIS — X32XXXA Exposure to sunlight, initial encounter: Secondary | ICD-10-CM | POA: Diagnosis not present

## 2020-11-11 DIAGNOSIS — D224 Melanocytic nevi of scalp and neck: Secondary | ICD-10-CM | POA: Diagnosis not present

## 2020-11-11 DIAGNOSIS — L728 Other follicular cysts of the skin and subcutaneous tissue: Secondary | ICD-10-CM | POA: Diagnosis not present

## 2020-11-11 DIAGNOSIS — L237 Allergic contact dermatitis due to plants, except food: Secondary | ICD-10-CM | POA: Diagnosis not present

## 2020-11-11 DIAGNOSIS — L720 Epidermal cyst: Secondary | ICD-10-CM | POA: Diagnosis not present

## 2020-11-11 DIAGNOSIS — L570 Actinic keratosis: Secondary | ICD-10-CM | POA: Diagnosis not present

## 2020-11-11 DIAGNOSIS — Z85828 Personal history of other malignant neoplasm of skin: Secondary | ICD-10-CM | POA: Diagnosis not present

## 2020-11-11 DIAGNOSIS — C4441 Basal cell carcinoma of skin of scalp and neck: Secondary | ICD-10-CM | POA: Diagnosis not present

## 2020-11-11 DIAGNOSIS — D2239 Melanocytic nevi of other parts of face: Secondary | ICD-10-CM | POA: Diagnosis not present

## 2020-12-05 DIAGNOSIS — C4441 Basal cell carcinoma of skin of scalp and neck: Secondary | ICD-10-CM | POA: Diagnosis not present

## 2021-07-11 ENCOUNTER — Other Ambulatory Visit: Payer: Self-pay

## 2021-07-11 ENCOUNTER — Ambulatory Visit (INDEPENDENT_AMBULATORY_CARE_PROVIDER_SITE_OTHER): Payer: BC Managed Care – PPO | Admitting: Urology

## 2021-07-11 ENCOUNTER — Encounter: Payer: Self-pay | Admitting: Urology

## 2021-07-11 VITALS — BP 113/76 | HR 71 | Ht 67.0 in | Wt 173.0 lb

## 2021-07-11 DIAGNOSIS — N5082 Scrotal pain: Secondary | ICD-10-CM | POA: Diagnosis not present

## 2021-07-11 LAB — URINALYSIS, COMPLETE
Bilirubin, UA: NEGATIVE
Glucose, UA: NEGATIVE
Ketones, UA: NEGATIVE
Leukocytes,UA: NEGATIVE
Nitrite, UA: NEGATIVE
Protein,UA: NEGATIVE
Specific Gravity, UA: 1.02 (ref 1.005–1.030)
Urobilinogen, Ur: 0.2 mg/dL (ref 0.2–1.0)
pH, UA: 6.5 (ref 5.0–7.5)

## 2021-07-11 LAB — MICROSCOPIC EXAMINATION
Bacteria, UA: NONE SEEN
Epithelial Cells (non renal): NONE SEEN /hpf (ref 0–10)

## 2021-07-11 NOTE — Progress Notes (Signed)
07/11/2021 8:28 AM   Gerald Atkinson [birth year] [id number]  Referring provider: Venia Carbon, MD [address] [place],  [place] [address]  Chief Complaint  Patient presents with   Testicle Pain    HPI: Gerald Atkinson is a 61 y.o. male self-referred for evaluation of scrotal pain.  2 year history of intermittent left hemiscrotal pain Episodes occur ~ every 2 months Severity mild and seems to worsen with scrotal manipulation History RALP in [place] 2012 PSA checked annually by his endocrinologist; in 2015 it was detectable at 0.01 and has slowly risen.  Last checked 04/2021 and was 0.17 No bothersome LUTS; denies dysuria or gross hematuria   PMH: Past Medical History:  Diagnosis Date   Crohn disease ([place])    GERD (gastroesophageal reflux disease)    Hypothyroidism    IBS (irritable bowel syndrome)    Multinodular goiter    with hypothyroidism after RAI   PONV (postoperative nausea and vomiting)    Prostate cancer [facility])     Surgical History: Past Surgical History:  Procedure Laterality Date   APPENDECTOMY     COLONOSCOPY WITH PROPOFOL N/A 10/14/2015   Procedure: COLONOSCOPY WITH PROPOFOL;  Surgeon: Lucilla Lame, MD;  Location: [facility];  Service: Endoscopy;  Laterality: N/A;   KNEE ARTHROSCOPY W/ MENISCAL REPAIR Bilateral ~2000 &2009   Dr Leanor Kail   ROBOT ASSISTED LAPAROSCOPIC RADICAL PROSTATECTOMY  2012    Home Medications:  Allergies as of 07/11/2021   No Known Allergies      Medication List        Accurate as of July 11, 2021  8:28 AM. If you have any questions, ask your nurse or doctor.          APPLE CIDER VINEGAR PO Take by mouth. spoonful   famotidine 10 MG tablet Commonly known as: PEPCID Take 10 mg by mouth 2 (two) times daily.   FISH OIL CONCENTRATE PO Take by mouth. Spoonful cod liver oil   hyoscyamine 0.125 MG SL tablet Commonly known as: LEVSIN SL Place 0.125 mg under the tongue every 4 (four)  hours as needed.   levothyroxine 150 MCG tablet Commonly known as: SYNTHROID Take 150 mcg by mouth daily. PM   liothyronine 5 MCG tablet Commonly known as: CYTOMEL Take 5 mcg by mouth daily.   multivitamin tablet Take 1 tablet by mouth daily.   PROBIOTIC DAILY PO Take by mouth.   VITAMIN D (CHOLECALCIFEROL) PO Take by mouth.        Allergies: No Known Allergies  Family History: Family History  Problem Relation Age of Onset   Cancer Father        bladder   Bladder Cancer Father    Diabetes Neg Hx    Heart disease Neg Hx     Social History:  reports that he has never smoked. He has never used smokeless tobacco. He reports current alcohol use of about 3.0 standard drinks per week. He reports that he does not use drugs.   Physical Exam: BP 113/76    Pulse 71    Ht 5\' 7"  (1.702 m)    Wt 173 lb (78.5 kg)    BMI 27.10 kg/m   Constitutional:  Alert and oriented, No acute distress. HEENT: [place] AT, moist mucus membranes.  Trachea midline, no masses. Cardiovascular: No clubbing, cyanosis, or edema. Respiratory: Normal respiratory effort, no increased work of breathing. GI: Abdomen is soft, nontender, nondistended, no abdominal masses GU: Phallus without  lesions.  Testes descended bilaterally without masses or tenderness.  Spermatic cord/epididymis palpably normal bilaterally.  No palpable hernia Skin: No rashes, bruises or suspicious lesions. Neurologic: Grossly intact, no focal deficits, moving all 4 extremities. Psychiatric: Normal mood and affect.  Laboratory Data:  Urinalysis Dipstick/microscopy negative   Assessment & Plan:    1.  Chronic left scrotal content pain Intermittent pain which has been mild Not that bothersome but he wanted to make sure it would not be related to his prior prostate surgery and to make sure it was nothing serious Examination is unremarkable Schedule scrotal sonogram and will call with results  2.  History prostate cancer RALP in  2012 PSA detectable and slowly rising   Abbie Sons, MD  [facility] [address] [place], [place] [address] [phone]

## 2021-07-25 ENCOUNTER — Ambulatory Visit: Payer: BC Managed Care – PPO

## 2023-01-20 ENCOUNTER — Telehealth: Payer: Self-pay | Admitting: Internal Medicine

## 2023-01-20 NOTE — Telephone Encounter (Signed)
Patient last seen in 2022- spoke with patient and they will check with new insurance first and then call to schedule an appointment

## 2023-05-23 ENCOUNTER — Other Ambulatory Visit: Payer: Self-pay

## 2023-05-23 ENCOUNTER — Encounter (HOSPITAL_BASED_OUTPATIENT_CLINIC_OR_DEPARTMENT_OTHER): Payer: Self-pay | Admitting: Emergency Medicine

## 2023-05-23 ENCOUNTER — Emergency Department
Admission: EM | Admit: 2023-05-23 | Discharge: 2023-05-23 | Discharge status: Against Medical Advice | Payer: BC Managed Care – PPO | Attending: Emergency Medicine | Admitting: Emergency Medicine

## 2023-05-23 ENCOUNTER — Emergency Department (HOSPITAL_BASED_OUTPATIENT_CLINIC_OR_DEPARTMENT_OTHER)
Admission: EM | Admit: 2023-05-23 | Discharge: 2023-05-23 | Disposition: A | Discharge status: Home / Self Care | Payer: BC Managed Care – PPO | Attending: Emergency Medicine | Admitting: Emergency Medicine

## 2023-05-23 DIAGNOSIS — R109 Unspecified abdominal pain: Secondary | ICD-10-CM | POA: Insufficient documentation

## 2023-05-23 DIAGNOSIS — Z5321 Procedure and treatment not carried out due to patient leaving prior to being seen by health care provider: Secondary | ICD-10-CM | POA: Diagnosis not present

## 2023-05-23 DIAGNOSIS — Z79899 Other long term (current) drug therapy: Secondary | ICD-10-CM | POA: Insufficient documentation

## 2023-05-23 DIAGNOSIS — R1010 Upper abdominal pain, unspecified: Secondary | ICD-10-CM | POA: Diagnosis present

## 2023-05-23 LAB — COMPREHENSIVE METABOLIC PANEL
ALT: 22 U/L (ref 0–44)
AST: 26 U/L (ref 15–41)
Albumin: 4.3 g/dL (ref 3.5–5.0)
Alkaline Phosphatase: 68 U/L (ref 38–126)
Anion gap: 9 (ref 5–15)
BUN: 18 mg/dL (ref 6–20)
CO2: 26 mmol/L (ref 22–32)
Calcium: 9.4 mg/dL (ref 8.9–10.3)
Chloride: 102 mmol/L (ref 98–111)
Creatinine, Ser: 1.1 mg/dL (ref 0.61–1.24)
GFR, Estimated: >60 mL/min (ref >60)
Glucose, Bld: 119 mg/dL — ABNORMAL HIGH (ref 70–99)
Potassium: 3.9 mmol/L (ref 3.5–5.1)
Sodium: 137 mmol/L (ref 135–145)
Total Bilirubin: 0.6 mg/dL (ref <1.2)
Total Protein: 7.5 g/dL (ref 6.5–8.1)

## 2023-05-23 LAB — CBC WITH DIFFERENTIAL/PLATELET
Abs Immature Granulocytes: 0.05 10*3/uL (ref 0.00–0.07)
Basophils Absolute: 0.1 10*3/uL (ref 0.0–0.1)
Basophils Relative: 1 %
Eosinophils Absolute: 0 10*3/uL (ref 0.0–0.5)
Eosinophils Relative: 0 %
HCT: 43.5 % (ref 39.0–52.0)
Hemoglobin: 15.1 g/dL (ref 13.0–17.0)
Immature Granulocytes: 0 %
Lymphocytes Relative: 10 %
Lymphs Abs: 1.5 10*3/uL (ref 0.7–4.0)
MCH: 30.2 pg (ref 26.0–34.0)
MCHC: 34.7 g/dL (ref 30.0–36.0)
MCV: 87 fL (ref 80.0–100.0)
Monocytes Absolute: 0.7 10*3/uL (ref 0.1–1.0)
Monocytes Relative: 5 %
Neutro Abs: 11.6 10*3/uL — ABNORMAL HIGH (ref 1.7–7.7)
Neutrophils Relative %: 84 %
Platelets: 274 10*3/uL (ref 150–400)
RBC: 5 MIL/uL (ref 4.22–5.81)
RDW: 13 % (ref 11.5–15.5)
WBC: 13.9 10*3/uL — ABNORMAL HIGH (ref 4.0–10.5)
nRBC: 0 % (ref 0.0–0.2)

## 2023-05-23 LAB — LIPASE, BLOOD: Lipase: 32 U/L (ref 11–51)

## 2023-05-23 MED ORDER — FAMOTIDINE 20 MG PO TABS
20.0000 mg | ORAL_TABLET | Freq: Once | ORAL | Status: DC
  Filled 2023-05-23: qty 1

## 2023-05-23 MED ORDER — KETOROLAC TROMETHAMINE 60 MG/2ML IM SOLN: 30.0000 mg | Freq: Once | INTRAMUSCULAR | Status: DC

## 2023-05-23 MED ORDER — PREDNISONE 20 MG PO TABS: ORAL_TABLET | ORAL | 0 refills | Status: DC

## 2023-05-23 MED ORDER — FAMOTIDINE IN NACL 20-0.9 MG/50ML-% IV SOLN
20.0000 mg | Freq: Once | INTRAVENOUS | Status: AC
  Administered 2023-05-23: 20 mg via INTRAVENOUS
  Filled 2023-05-23: qty 50

## 2023-05-23 MED ORDER — METHYLPREDNISOLONE SODIUM SUCC 125 MG IJ SOLR
125.0000 mg | Freq: Once | INTRAMUSCULAR | Status: AC
  Administered 2023-05-23: 125 mg via INTRAVENOUS
  Filled 2023-05-23: qty 2

## 2023-05-23 MED ORDER — KETOROLAC TROMETHAMINE 30 MG/ML IJ SOLN
15.0000 mg | Freq: Once | INTRAMUSCULAR | Status: AC
  Administered 2023-05-23: 15 mg via INTRAVENOUS
  Filled 2023-05-23: qty 1

## 2023-05-23 NOTE — ED Notes (Signed)
ED Provider at bedside. 

## 2023-05-23 NOTE — ED Triage Notes (Signed)
Pt presents to ER with c/o mid-upper abd pain that started appx 2 hours ago.  Pt states he has a hx of crohns disease, and states he attempted to take hyoscyamine without any relief.  Pt denies any n/v/d at this time.  Pt is otherwise A&O x4 and in NAD at this time.

## 2023-05-23 NOTE — ED Notes (Signed)
Pt refusing blood work at this time.  States he has gone through this before, and does not believe he needs the blood work.

## 2023-05-23 NOTE — ED Triage Notes (Signed)
Pt reports having a "crohns attack" that started about 3 hours ago. He reports taking his home medications without relief. Denies fever, n/v/d.
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
## 2023-08-16 ENCOUNTER — Other Ambulatory Visit: Payer: Self-pay | Admitting: Infectious Diseases

## 2023-08-16 DIAGNOSIS — C61 Malignant neoplasm of prostate: Secondary | ICD-10-CM

## 2023-08-16 DIAGNOSIS — E890 Postprocedural hypothyroidism: Secondary | ICD-10-CM

## 2023-08-16 DIAGNOSIS — K5000 Crohn's disease of small intestine without complications: Secondary | ICD-10-CM

## 2023-08-16 DIAGNOSIS — K50919 Crohn's disease, unspecified, with unspecified complications: Secondary | ICD-10-CM

## 2023-08-27 ENCOUNTER — Ambulatory Visit
Admission: RE | Admit: 2023-08-27 | Discharge: 2023-08-27 | Disposition: A | Discharge status: Home / Self Care | Payer: Self-pay | Source: Ambulatory Visit | Attending: Infectious Diseases | Admitting: Infectious Diseases

## 2023-08-27 DIAGNOSIS — E890 Postprocedural hypothyroidism: Secondary | ICD-10-CM | POA: Insufficient documentation

## 2023-08-27 DIAGNOSIS — K50919 Crohn's disease, unspecified, with unspecified complications: Secondary | ICD-10-CM | POA: Insufficient documentation

## 2023-08-27 DIAGNOSIS — C61 Malignant neoplasm of prostate: Secondary | ICD-10-CM | POA: Insufficient documentation

## 2023-08-27 DIAGNOSIS — K5000 Crohn's disease of small intestine without complications: Secondary | ICD-10-CM | POA: Insufficient documentation

## 2023-09-16 ENCOUNTER — Other Ambulatory Visit: Payer: Self-pay | Admitting: Cardiology

## 2023-09-16 DIAGNOSIS — R931 Abnormal findings on diagnostic imaging of heart and coronary circulation: Secondary | ICD-10-CM

## 2023-10-29 ENCOUNTER — Telehealth: Payer: Self-pay | Admitting: Urology

## 2023-10-29 NOTE — Telephone Encounter (Signed)
 Advised patient and he is scheduled for 11/05/23 at 12:00 pm.

## 2023-10-29 NOTE — Telephone Encounter (Signed)
 Patient called requesting a new referral for US  be sent to Radiology. Patient was scheduled on 07/25/21 for one per [facility], but cancelled because he felt he did not need it at that time. Please advise patient at [phone].

## 2023-11-05 ENCOUNTER — Ambulatory Visit: Admitting: Urology

## 2023-11-05 ENCOUNTER — Encounter: Payer: Self-pay | Admitting: Urology

## 2023-11-05 VITALS — BP 106/71 | HR 63 | Ht 67.0 in | Wt 165.0 lb

## 2023-11-05 DIAGNOSIS — C61 Malignant neoplasm of prostate: Secondary | ICD-10-CM | POA: Diagnosis not present

## 2023-11-05 DIAGNOSIS — N5089 Other specified disorders of the male genital organs: Secondary | ICD-10-CM | POA: Diagnosis not present

## 2023-11-05 NOTE — Progress Notes (Signed)
 I, Maysun Jamey Mccallum, acting as a scribe for Geraline Knapp, MD., have documented all relevant documentation on the behalf of Geraline Knapp, MD, as directed by Geraline Knapp, MD while in the presence of Geraline Knapp, MD.  11/05/2023 4:19 PM   Gerald Atkinson 12/05/1960 [id number]  Referring provider: Helaine Llanos, MD [address] [place],  [place] [address]  No chief complaint on file.   HPI: Gerald Atkinson is a 63 y.o. male called for an appointment for evaluation of a left hemiscretal mass.   Initially seen  07/2021 for left hemiscrotal pain. Exam at that time was unremarkable. Scrotal ultrasound was scheduled, however he elected to cancel the appointment as his out-of-pocket cost was going to be $1,200, and his pain had significantly improved.  History of radical prostatectomy in [place]  in 2012. His PSA has slowly risen. Less than a year ago it was detectable but less than 0.1 when checked between 2018 and 2020.  2022 it had increased to 0.03; 0.16 in May 2023; 0.22 May 2022; 0.27 November 2022, and 0.01 June 2023. No bothersome lower urinary tract symptoms  Denies dysuria or gross hematuria. Approximately 1 week ago, he noted a left hemiscrotal mass, which has not been painful.    PMH: Past Medical History:  Diagnosis Date   Crohn disease (HCC)    GERD (gastroesophageal reflux disease)    Hypothyroidism    IBS (irritable bowel syndrome)    Multinodular goiter    with hypothyroidism after RAI   PONV (postoperative nausea and vomiting)    Prostate cancer [facility])     Surgical History: Past Surgical History:  Procedure Laterality Date   APPENDECTOMY     COLONOSCOPY WITH PROPOFOL  N/A 10/14/2015   Procedure: COLONOSCOPY WITH PROPOFOL ;  Surgeon: Marnee Sink, MD;  Location: [facility];  Service: Endoscopy;  Laterality: N/A;   KNEE ARTHROSCOPY W/ MENISCAL REPAIR Bilateral ~2000 &2009   Dr Josephus Nida   ROBOT ASSISTED LAPAROSCOPIC RADICAL  PROSTATECTOMY  2012    Home Medications:  Allergies as of 11/05/2023   No Known Allergies      Medication List        Accurate as of November 05, 2023  4:19 PM. If you have any questions, ask your nurse or doctor.          APPLE CIDER VINEGAR PO Take by mouth. spoonful   famotidine  10 MG tablet Commonly known as: PEPCID  Take 10 mg by mouth 2 (two) times daily.   FISH OIL CONCENTRATE PO Take by mouth. Spoonful cod liver oil   hyoscyamine 0.125 MG SL tablet Commonly known as: LEVSIN SL Place 0.125 mg under the tongue every 4 (four) hours as needed.   levothyroxine 150 MCG tablet Commonly known as: SYNTHROID Take 150 mcg by mouth daily. PM   liothyronine 5 MCG tablet Commonly known as: CYTOMEL Take 5 mcg by mouth daily.   multivitamin tablet Take 1 tablet by mouth daily.   predniSONE  20 MG tablet Commonly known as: DELTASONE  3 tabs po daily x 3 days, then 2 tabs x 3 days, then 1.5 tabs x 3 days, then 1 tab x 3 days, then 0.5 tabs x 3 days   PROBIOTIC DAILY PO Take by mouth.   VITAMIN D (CHOLECALCIFEROL) PO Take by mouth.        Allergies: No Known Allergies  Family History: Family History  Problem Relation Age of Onset   Cancer Father  bladder   Bladder Cancer Father    Diabetes Neg Hx    Heart disease Neg Hx     Social History:  reports that he has never smoked. He has never used smokeless tobacco. He reports current alcohol use of about 3.0 standard drinks of alcohol per week. He reports that he does not use drugs.   Physical Exam: BP 106/71   Pulse 63   Ht 5\' 7"  (1.702 m)   Wt 165 lb (74.8 kg)   BMI 25.84 kg/m   Constitutional:  Alert and oriented, No acute distress. HEENT: [place] AT Respiratory: Normal respiratory effort, no increased work of breathing. GU: Phallus without lesions. Testes descended bilaterally without masses or tenderness. In the vicinity of the tail of the epididymis is a 5 mm cystic mass. Psychiatric: Normal mood and  affect.   Assessment & Plan:    1. Prostate cancer Status post-radical prostatectomy 2012 with rising PSA Recommend PSMA/PET.   2. Left scrotal mass Most likely a small epidermal cyst. Will schedule an office ultrasound of this area to demonstrate this mass is cystic. Will do so at the time of his PSMA/PET follow-up.  I have reviewed the above documentation for accuracy and completeness, and I agree with the above.   Geraline Knapp, MD  [facility] [address] [place], [place] [address] [phone]

## 2023-11-19 ENCOUNTER — Ambulatory Visit
Admission: RE | Admit: 2023-11-19 | Discharge: 2023-11-19 | Disposition: A | Discharge status: Home / Self Care | Source: Ambulatory Visit | Attending: Urology | Admitting: Urology

## 2023-11-19 DIAGNOSIS — C61 Malignant neoplasm of prostate: Secondary | ICD-10-CM | POA: Diagnosis present

## 2023-11-19 MED ORDER — FLOTUFOLASTAT F 18 GALLIUM 296-5846 MBQ/ML IV SOLN
8.6700 | Freq: Once | INTRAVENOUS | Status: AC
Start: 2023-11-19 — End: 2023-11-19
  Administered 2023-11-19: 8.67 via INTRAVENOUS
  Filled 2023-11-19: qty 9

## 2023-11-25 ENCOUNTER — Ambulatory Visit: Payer: Self-pay | Admitting: Urology

## 2023-11-25 ENCOUNTER — Encounter (HOSPITAL_COMMUNITY): Payer: Self-pay

## 2023-11-26 ENCOUNTER — Telehealth (HOSPITAL_COMMUNITY): Payer: Self-pay | Admitting: *Deleted

## 2023-11-26 NOTE — Telephone Encounter (Signed)
Reaching out to patient to offer assistance regarding upcoming cardiac imaging study; pt verbalizes understanding of appt date/time, parking situation and where to check in, pre-test NPO status  and verified current allergies; name and call back number provided for further questions should they arise ? ?Adalaya Irion RN Navigator Cardiac Imaging ?[facility] ?[phone] office ?[phone] cell ? ?

## 2023-11-29 ENCOUNTER — Ambulatory Visit
Admission: RE | Admit: 2023-11-29 | Discharge: 2023-11-29 | Disposition: A | Discharge status: Home / Self Care | Source: Ambulatory Visit | Attending: Cardiology | Admitting: Cardiology

## 2023-11-29 DIAGNOSIS — R931 Abnormal findings on diagnostic imaging of heart and coronary circulation: Secondary | ICD-10-CM | POA: Insufficient documentation

## 2023-11-29 DIAGNOSIS — I2510 Atherosclerotic heart disease of native coronary artery without angina pectoris: Secondary | ICD-10-CM | POA: Diagnosis not present

## 2023-11-29 MED ORDER — DILTIAZEM HCL 25 MG/5ML IV SOLN: 10.0000 mg | INTRAVENOUS | Status: DC | PRN

## 2023-11-29 MED ORDER — IOHEXOL 350 MG/ML SOLN
80.0000 mL | Freq: Once | INTRAVENOUS | Status: AC | PRN
  Administered 2023-11-29: 80 mL via INTRAVENOUS

## 2023-11-29 MED ORDER — NITROGLYCERIN 0.4 MG SL SUBL
0.8000 mg | SUBLINGUAL_TABLET | Freq: Once | SUBLINGUAL | Status: AC
  Administered 2023-11-29: 0.4 mg via SUBLINGUAL
  Filled 2023-11-29: qty 25

## 2023-11-29 MED ORDER — METOPROLOL TARTRATE 5 MG/5ML IV SOLN: 10.0000 mg | INTRAVENOUS | Status: DC | PRN

## 2023-11-29 NOTE — Progress Notes (Signed)
 Patient tolerated procedure well. Ambulate w/o difficulty. Denies any lightheadedness or being dizzy. Pt denies any pain at this time. Sitting in chair. Pt is encouraged to drink additional water throughout the day and reason explained to patient. Patient verbalized understanding and all questions answered. ABC intact. No further needs at this time. Discharge from procedure area w/o issues.

## 2023-12-20 ENCOUNTER — Ambulatory Visit: Admitting: Urology

## 2023-12-31 ENCOUNTER — Encounter: Payer: Self-pay | Admitting: Urology

## 2023-12-31 ENCOUNTER — Ambulatory Visit (INDEPENDENT_AMBULATORY_CARE_PROVIDER_SITE_OTHER): Admitting: Urology

## 2023-12-31 VITALS — BP 122/76 | HR 71

## 2023-12-31 DIAGNOSIS — C61 Malignant neoplasm of prostate: Secondary | ICD-10-CM

## 2023-12-31 DIAGNOSIS — N4340 Spermatocele of epididymis, unspecified: Secondary | ICD-10-CM | POA: Diagnosis not present

## 2023-12-31 NOTE — Progress Notes (Signed)
 12/31/2023 9:56 AM   Gerald Atkinson [birth year] [id number]  Referring provider: Jimmy Charlie FERNS, MD [address] [place],  [place] [address]  Chief Complaint  Patient presents with   Results    HPI: Gerald Atkinson is a 63 y.o. male presents for follow-up.  Refer to my prior note 11/05/2023.  PSMA PET 11/19/2023 showed no evidence of recurrent disease in the pelvis.  No evidence of adenopathy, visceral or skeletal metastasis Repeat PSA 12/17/2023 was 0.3   PMH: Past Medical History:  Diagnosis Date   Crohn disease (HCC)    GERD (gastroesophageal reflux disease)    Hypothyroidism    IBS (irritable bowel syndrome)    Multinodular goiter    with hypothyroidism after RAI   PONV (postoperative nausea and vomiting)    Prostate cancer [facility])     Surgical History: Past Surgical History:  Procedure Laterality Date   APPENDECTOMY     COLONOSCOPY WITH PROPOFOL  N/A 10/14/2015   Procedure: COLONOSCOPY WITH PROPOFOL ;  Surgeon: Rogelia Copping, MD;  Location[facility];  Service: Endoscopy;  Laterality: N/A;   KNEE ARTHROSCOPY W/ MENISCAL REPAIR Bilateral ~2000 &2009   Dr Helayne Glenn   ROBOT ASSISTED LAPAROSCOPIC RADICAL PROSTATECTOMY  06/01/2010    Home Medications:  Allergies as of 12/31/2023   No Known Allergies      Medication List        Accurate as of December 31, 2023  9:56 AM. If you have any questions, ask your nurse or doctor.          APPLE CIDER VINEGAR PO Take by mouth. spoonful   aspirin EC 81 MG tablet Take 81 mg by mouth daily.   famotidine  10 MG tablet Commonly known as: PEPCID  Take 10 mg by mouth 2 (two) times daily.   Fenbendazole Powd 222 mg by Other route daily.   FISH OIL CONCENTRATE PO Take by mouth. Spoonful cod liver oil   hyoscyamine 0.125 MG SL tablet Commonly known as: LEVSIN SL Place 0.125 mg under the tongue every 4 (four) hours as needed.   IVERMECTIN PO Take 30 mg by mouth daily.   liothyronine 5 MCG  tablet Commonly known as: CYTOMEL Take 5 mcg by mouth daily.   multivitamin tablet Take 1 tablet by mouth daily.   PROBIOTIC DAILY PO Take by mouth.   rosuvastatin 10 MG tablet Commonly known as: CRESTOR Take 10 mg by mouth daily.   Synthroid 137 MCG tablet Generic drug: levothyroxine Take 137 mcg by mouth daily. What changed: Another medication with the same name was removed. Continue taking this medication, and follow the directions you see here. Changed by: Glendia JAYSON Barba   VITAMIN D (CHOLECALCIFEROL) PO Take by mouth.        Allergies: No Known Allergies  Family History: Family History  Problem Relation Age of Onset   Cancer Father        bladder   Bladder Cancer Father    Diabetes Neg Hx    Heart disease Neg Hx     Social History:  reports that he has never smoked. He has never used smokeless tobacco. He reports current alcohol use of about 3.0 standard drinks of alcohol per week. He reports that he does not use drugs.   Physical Exam: BP 122/76 (BP Location: Left Arm, Patient Position: Sitting, Cuff Size: Normal)   Pulse 71   Constitutional:  Alert and oriented, No acute distress. GU: Limited scrotal sonogram was performed of the  left testis which showed normal echotexture.  Anechoic extratesticular mass identified consistent with a small spermatocele    Assessment & Plan:    1.  Prostate cancer We discussed a detectable and rising PSA is concerning for recurrent prostate cancer. PSMA/PET was negative and he most likely has microscopic disease Current PSA 0.30.  Recommended radiation oncology evaluation to discuss salvage therapy versus surveillance  2.  Left spermatocele No treatment recommended     Glendia JAYSON Barba, MD  [facility] [address], [address] [place], [place] [address] [phone]

## 2024-01-10 ENCOUNTER — Ambulatory Visit
Admission: RE | Admit: 2024-01-10 | Discharge: 2024-01-10 | Disposition: A | Discharge status: Home / Self Care | Source: Ambulatory Visit | Attending: Radiation Oncology | Admitting: Radiation Oncology

## 2024-01-10 ENCOUNTER — Encounter: Payer: Self-pay | Admitting: Radiation Oncology

## 2024-01-10 VITALS — BP 123/78 | HR 56 | Temp 98.0°F | Resp 16 | Wt 164.0 lb

## 2024-01-10 DIAGNOSIS — K219 Gastro-esophageal reflux disease without esophagitis: Secondary | ICD-10-CM | POA: Diagnosis not present

## 2024-01-10 DIAGNOSIS — K589 Irritable bowel syndrome without diarrhea: Secondary | ICD-10-CM | POA: Diagnosis not present

## 2024-01-10 DIAGNOSIS — Z9049 Acquired absence of other specified parts of digestive tract: Secondary | ICD-10-CM | POA: Insufficient documentation

## 2024-01-10 DIAGNOSIS — Z8052 Family history of malignant neoplasm of bladder: Secondary | ICD-10-CM | POA: Diagnosis not present

## 2024-01-10 DIAGNOSIS — Z79890 Hormone replacement therapy (postmenopausal): Secondary | ICD-10-CM | POA: Diagnosis not present

## 2024-01-10 DIAGNOSIS — Z7982 Long term (current) use of aspirin: Secondary | ICD-10-CM | POA: Insufficient documentation

## 2024-01-10 DIAGNOSIS — K5090 Crohn's disease, unspecified, without complications: Secondary | ICD-10-CM | POA: Diagnosis not present

## 2024-01-10 DIAGNOSIS — Z79899 Other long term (current) drug therapy: Secondary | ICD-10-CM | POA: Insufficient documentation

## 2024-01-10 DIAGNOSIS — C61 Malignant neoplasm of prostate: Secondary | ICD-10-CM | POA: Insufficient documentation

## 2024-01-10 DIAGNOSIS — E039 Hypothyroidism, unspecified: Secondary | ICD-10-CM | POA: Insufficient documentation

## 2024-01-10 NOTE — Consult Note (Signed)
 NEW PATIENT EVALUATION  Name: Gerald Atkinson  MRN: [id number]  Date:   01/10/2024     DOB: July 26, 1960   This 63 y.o. male patient presents to the clinic for initial evaluation of probable recurrent prostate cancer and patient status post radical prostatectomy in 2012 with rising PSA.  REFERRING PHYSICIAN: Francisca Redell BROCKS, MD  CHIEF COMPLAINT:  Chief Complaint  Patient presents with   Prostate Cancer    DIAGNOSIS: The encounter diagnosis was Prostate cancer [facility]).   PREVIOUS INVESTIGATIONS:  PSMA PET scan reviewed Clinical notes reviewed Pathology reports are nbeing attempted to be retrieved  HPI: Patient is a 63 year old male status post a radical prostatectomy in [place]  back in 2012.  He members this being a Gleason 7 not sure if that is 3+4 or 4+3 adenocarcinoma the prostate.  He remembered the surgeon telling him there was 1 focal area of possible positive margin although we do not have his records at this time for complete evaluation.  His PSA was 0.3 back in mid July and 1 year ago it was 0.28.  He was referred to radiation oncology for consideration of salvage treatment.  He is doing well symptomatically specifically denies urinary incontinence any dysuria any increased lower urinary tract symptoms or bone pain.  Patient does have a history of Crohn's disease he did have a PSMA PET scan performed back in June showing no evidence of prostate cancer recurrence in the prostatic fossa no evidence of metastatic adenopathy in the pelvis and no evidence of visceral or skeletal metastasis.  He is now referred to radiation oncology for consideration of salvage treatment.  PLANNED TREATMENT REGIMEN: Observation at this time  PAST MEDICAL HISTORY:  has a past medical history of Crohn disease (HCC), GERD (gastroesophageal reflux disease), Hypothyroidism, IBS (irritable bowel syndrome), Multinodular goiter, PONV (postoperative nausea and vomiting), and Prostate cancer (HCC).    PAST  SURGICAL HISTORY:  Past Surgical History:  Procedure Laterality Date   APPENDECTOMY     COLONOSCOPY WITH PROPOFOL  N/A 10/14/2015   Procedure: COLONOSCOPY WITH PROPOFOL ;  Surgeon: Rogelia Copping, MD;  Location: [facility];  Service: Endoscopy;  Laterality: N/A;   KNEE ARTHROSCOPY W/ MENISCAL REPAIR Bilateral ~2000 &2009   Dr Helayne Suzzane Quilter   ROBOT ASSISTED LAPAROSCOPIC RADICAL PROSTATECTOMY  06/01/2010    FAMILY HISTORY: family history includes Bladder Cancer in his father; Cancer in his father.  SOCIAL HISTORY:  reports that he has never smoked. He has never used smokeless tobacco. He reports current alcohol use of about 3.0 standard drinks of alcohol per week. He reports that he does not use drugs.  ALLERGIES: Patient has no known allergies.  MEDICATIONS:  Current Outpatient Medications  Medication Sig Dispense Refill   APPLE CIDER VINEGAR PO Take by mouth. spoonful     aspirin EC 81 MG tablet Take 81 mg by mouth daily.     famotidine  (PEPCID ) 10 MG tablet Take 10 mg by mouth 2 (two) times daily.     Fenbendazole POWD 222 mg by Other route daily.     hyoscyamine (LEVSIN SL) 0.125 MG SL tablet Place 0.125 mg under the tongue every 4 (four) hours as needed.     IVERMECTIN PO Take 30 mg by mouth daily.     liothyronine (CYTOMEL) 5 MCG tablet Take 5 mcg by mouth daily.     Multiple Vitamin (MULTIVITAMIN) tablet Take 1 tablet by mouth daily.     Omega-3 Fatty Acids (FISH OIL CONCENTRATE PO) Take by mouth.  Spoonful cod liver oil     Probiotic Product (PROBIOTIC DAILY PO) Take by mouth.     rosuvastatin (CRESTOR) 10 MG tablet Take 10 mg by mouth daily.     SYNTHROID 137 MCG tablet Take 137 mcg by mouth daily.     VITAMIN D, CHOLECALCIFEROL, PO Take by mouth.     No current facility-administered medications for this encounter.    ECOG PERFORMANCE STATUS:  0 - Asymptomatic  REVIEW OF SYSTEMS: Patient does have a history of Crohn's disease Patient denies any weight loss,  fatigue, weakness, fever, chills or night sweats. Patient denies any loss of vision, blurred vision. Patient denies any ringing  of the ears or hearing loss. No irregular heartbeat. Patient denies heart murmur or history of fainting. Patient denies any chest pain or pain radiating to her upper extremities. Patient denies any shortness of breath, difficulty breathing at night, cough or hemoptysis. Patient denies any swelling in the lower legs. Patient denies any nausea vomiting, vomiting of blood, or coffee ground material in the vomitus. Patient denies any stomach pain. Patient states has had normal bowel movements no significant constipation or diarrhea. Patient denies any dysuria, hematuria or significant nocturia. Patient denies any problems walking, swelling in the joints or loss of balance. Patient denies any skin changes, loss of hair or loss of weight. Patient denies any excessive worrying or anxiety or significant depression. Patient denies any problems with insomnia. Patient denies excessive thirst, polyuria, polydipsia. Patient denies any swollen glands, patient denies easy bruising or easy bleeding. Patient denies any recent infections, allergies or URI. Patient s visual fields have not changed significantly in recent time.   PHYSICAL EXAM: BP 123/78   Pulse (!) 56   Temp 98 F (36.7 C) (Tympanic)   Resp 16   Wt 164 lb (74.4 kg)   BMI 25.69 kg/m  Well-developed well-nourished patient in NAD. HEENT reveals PERLA, EOMI, discs not visualized.  Oral cavity is clear. No oral mucosal lesions are identified. Neck is clear without evidence of cervical or supraclavicular adenopathy. Lungs are clear to A&P. Cardiac examination is essentially unremarkable with regular rate and rhythm without murmur rub or thrill. Abdomen is benign with no organomegaly or masses noted. Motor sensory and DTR levels are equal and symmetric in the upper and lower extremities. Cranial nerves II through XII are grossly intact.  Proprioception is intact. No peripheral adenopathy or edema is identified. No motor or sensory levels are noted. Crude visual fields are within normal range.  LABORATORY DATA: Pathology reports being attempted to be retrieved from his prior radical prostatectomy    RADIOLOGY RESULTS: PSMA PET scan reviewed   IMPRESSION: Possible biochemical recurrence of prostate cancer and patient status post radical prostatectomy in 2012 now with slowly rising PSA  PLAN: Present time I feel inclined to observe the patient and see him back in 4 months with a repeat PSA.  In the meantime we will attempt to retrieve his clinical records from [place]  pertaining to his prostatectomy and surgical pathology reports.  Certainly if we see a continued rise in his PSA would offer salvage radiation therapy.  Patient is comfortable with my recommendations at this time we will see him back in 4 months with repeat PSA at that time.  Patient does have a history of Crohn's disease would be my intention to forego pelvic lymph node involvement unless poor prognostic factors are noted on his prostatectomy specimen.  I would like to take this opportunity to thank you  for allowing me to participate in the care of your patient.SABRA Marcey Penton, MD

## 2024-05-11 ENCOUNTER — Ambulatory Visit: Admitting: Radiation Oncology

## 2024-05-29 ENCOUNTER — Ambulatory Visit: Admitting: Radiation Oncology

## 2024-06-05 ENCOUNTER — Ambulatory Visit
Admission: RE | Admit: 2024-06-05 | Discharge: 2024-06-05 | Disposition: A | Discharge status: Home / Self Care | Source: Ambulatory Visit | Attending: Radiation Oncology | Admitting: Radiation Oncology

## 2024-06-05 ENCOUNTER — Encounter: Payer: Self-pay | Admitting: Radiation Oncology

## 2024-06-05 VITALS — BP 106/70 | HR 60 | Temp 98.3°F | Resp 16 | Wt 164.0 lb

## 2024-06-05 DIAGNOSIS — C61 Malignant neoplasm of prostate: Secondary | ICD-10-CM

## 2024-06-05 NOTE — Progress Notes (Signed)
 Radiation Oncology Follow up Note  Name: Gerald Atkinson   Date:   06/05/2024 MRN:  [id number] DOB: 21-Dec-1960    This 64 y.o. male presents to the clinic today for 63-month follow-up status post initial consultation in patient with rising PSA after radical prostatectomy in 2012.  REFERRING PROVIDER: [facility]  HPI: Patient is a 64 year old male now out 38-month since initial consultation back in August 2025.  He is status post radical prostatectomy in [place]  in 2012 for a Gleason 7 adenocarcinoma.  He remembers being told there was 1 positive area of possible margin involvement and we have been requesting his pathology reports from [place] in [place] .  Patient does have a history of Crohn's disease.  PET PSMA PET scan performed back in June showed no evidence of prostate cancer recurrence in the prostatic fossa or any evidence of pelvic adenopathy.  We decided to track his PSA which has now climbed from.  0.3 back in July 2 0.38 recently.  His symptoms have not worsened.  He is having no bone pain.  COMPLICATIONS OF TREATMENT: none  FOLLOW UP COMPLIANCE: keeps appointments   PHYSICAL EXAM:  BP 106/70   Pulse 60   Temp 98.3 F (36.8 C) (Tympanic)   Resp 16   Wt 164 lb (74.4 kg)   BMI 25.69 kg/m  Well-developed well-nourished patient in NAD. HEENT reveals PERLA, EOMI, discs not visualized.  Oral cavity is clear. No oral mucosal lesions are identified. Neck is clear without evidence of cervical or supraclavicular adenopathy. Lungs are clear to A&P. Cardiac examination is essentially unremarkable with regular rate and rhythm without murmur rub or thrill. Abdomen is benign with no organomegaly or masses noted. Motor sensory and DTR levels are equal and symmetric in the upper and lower extremities. Cranial nerves II through XII are grossly intact. Proprioception is intact. No peripheral adenopathy or edema is identified. No motor or sensory levels are noted. Crude visual fields are within  normal range.  RADIOLOGY RESULTS: No current films for review  PLAN: At this time again with the continued rise in his PSA I have recommended salvage radiation therapy.  I have told him there is an 80% chance of cure with complete resolution of his PSA.  I am still trying to obtain his records from [facility] in [place]  regarding his prostate surgery.  I have asked urology to give him a 8-month Eligard injection to be used concurrently with his salvage radiation.  Risks and benefits of treatment including possible worsening slightly of his urinary incontinence.  Fatigue alteration blood counts possible increased lower Neri tract symptoms diarrhea all were discussed in detail with the patient.  I have scheduled him for CT simulation in about 2 weeks.  Patient comprehends my recommendations well.  I would like to take this opportunity to thank you for allowing me to participate in the care of your patient.SABRA Marcey Penton, MD

## 2024-06-15 ENCOUNTER — Ambulatory Visit
Admission: RE | Admit: 2024-06-15 | Discharge: 2024-06-15 | Disposition: A | Discharge status: Home / Self Care | Source: Ambulatory Visit | Attending: Radiation Oncology | Admitting: Radiation Oncology

## 2024-06-15 DIAGNOSIS — C61 Malignant neoplasm of prostate: Secondary | ICD-10-CM | POA: Insufficient documentation

## 2024-06-15 DIAGNOSIS — Z510 Encounter for antineoplastic radiation therapy: Secondary | ICD-10-CM | POA: Diagnosis not present

## 2024-06-16 ENCOUNTER — Telehealth: Payer: Self-pay

## 2024-06-16 NOTE — Telephone Encounter (Signed)
 Auth Submission: APPROVED Site of care: Urology Payer: [employer]  commercial Medication & CPT/J Code(s) submitted: Eligard [id number] Diagnosis Code:  Route of submission (phone, fax, portal): phone Microbiologist Rx Phone # [phone] Fax # Auth type: Buy/Bill PB Units/visits requested: 45mg  x 2 doses Reference number: [id number] Approval from: 06/16/24 to 06/08/25

## 2024-06-19 ENCOUNTER — Encounter: Payer: Self-pay | Admitting: Physician Assistant

## 2024-06-19 ENCOUNTER — Ambulatory Visit: Admitting: Physician Assistant

## 2024-06-19 VITALS — BP 102/68 | HR 65 | Ht 67.0 in | Wt 164.5 lb

## 2024-06-19 DIAGNOSIS — C61 Malignant neoplasm of prostate: Secondary | ICD-10-CM

## 2024-06-19 MED ORDER — LEUPROLIDE ACETATE (6 MONTH) 45 MG ~~LOC~~ KIT
45.0000 mg | PACK | Freq: Once | SUBCUTANEOUS | Status: AC
  Administered 2024-06-19: 45 mg via SUBCUTANEOUS

## 2024-06-19 NOTE — Patient Instructions (Signed)
 Please take the following dietary supplements for the duration of your hormone suppression therapy to reduce your risk for bone loss: -Calcium 1000-1200mg  daily -Vitamin D 800-1000IU daily

## 2024-06-19 NOTE — Progress Notes (Unsigned)
 Eligard  SubQ Injection   Due to Prostate Cancer patient is present today for a Eligard  Injection.  Medication: Eligard  6 month Dose: 45 mg  Location: right  Lot: [id number] Exp: 11/2024 [id number]:[id number]  Patient tolerated well, no complications were noted  Performed by: Myrtha Dante LATHER

## 2024-06-20 MED ORDER — TADALAFIL 20 MG PO TABS: ORAL_TABLET | ORAL | 5 refills | Status: AC

## 2024-06-20 NOTE — Progress Notes (Signed)
 Patient presented to clinic today for initiation of 6 months of ADT with Eligard  per Drs. Stoioff and Chrystal.   Patient reports staying rather active and keeping a healthy diet.  He has some occasional ED and is curious about medications to help support this.  Given planned ADT, I will send in tadalafil  20 mg demand dose for him to try.  We discussed the anticipated side effects of ADT today including hot flashes, decreased libido, erectile dysfunction, fatigue, weight gain, bone loss, muscle mass loss, brain fog, increased cholesterol, increased blood pressure, increased blood sugar, and increased risk for stroke and heart attack.  I counseled him to maintain a healthy diet and continue regular exercise including weightbearing exercise to mitigate bone and muscle loss for the duration of therapy.  Additionally, I counseled him to start daily calcium 1000-1200mg  and vitamin D 800-1000IU supplements to reduce bone loss.  Lastly, I encouraged him to maintain regular follow-ups with his PCP while on hormone therapy. He expressed understanding, all questions answered. Printed resources on ADT provided today.  Aymar Whitfill, PA-C 06/20/24 5:19 PM  I spent 12 minutes on the day of the encounter to include pre-visit record review, face-to-face time with the patient, and post-visit ordering of tests.

## 2024-06-21 DIAGNOSIS — C61 Malignant neoplasm of prostate: Secondary | ICD-10-CM | POA: Diagnosis not present

## 2024-06-22 ENCOUNTER — Other Ambulatory Visit: Payer: Self-pay | Admitting: *Deleted

## 2024-06-22 DIAGNOSIS — C61 Malignant neoplasm of prostate: Secondary | ICD-10-CM

## 2024-06-26 ENCOUNTER — Ambulatory Visit

## 2024-06-27 ENCOUNTER — Ambulatory Visit

## 2024-06-28 ENCOUNTER — Ambulatory Visit
Admission: RE | Admit: 2024-06-28 | Discharge: 2024-06-28 | Attending: Radiation Oncology | Admitting: Radiation Oncology

## 2024-06-28 ENCOUNTER — Ambulatory Visit

## 2024-06-29 ENCOUNTER — Other Ambulatory Visit: Payer: Self-pay

## 2024-06-29 ENCOUNTER — Ambulatory Visit
Admission: RE | Admit: 2024-06-29 | Discharge: 2024-06-29 | Disposition: A | Discharge status: Home / Self Care | Source: Ambulatory Visit | Attending: Radiation Oncology | Admitting: Radiation Oncology

## 2024-06-29 DIAGNOSIS — C61 Malignant neoplasm of prostate: Secondary | ICD-10-CM | POA: Diagnosis not present

## 2024-06-29 LAB — RAD ONC ARIA SESSION SUMMARY
Course Elapsed Days: 0
Plan Fractions Treated to Date: 1
Plan Prescribed Dose Per Fraction: 2 Gy
Plan Total Fractions Prescribed: 35
Plan Total Prescribed Dose: 70 Gy
Reference Point Dosage Given to Date: 2 Gy
Reference Point Session Dosage Given: 2 Gy
Session Number: 1

## 2024-06-30 ENCOUNTER — Ambulatory Visit
Admission: RE | Admit: 2024-06-30 | Discharge: 2024-06-30 | Disposition: A | Source: Ambulatory Visit | Attending: Radiation Oncology | Admitting: Radiation Oncology

## 2024-06-30 ENCOUNTER — Other Ambulatory Visit: Payer: Self-pay

## 2024-06-30 DIAGNOSIS — C61 Malignant neoplasm of prostate: Secondary | ICD-10-CM | POA: Diagnosis not present

## 2024-06-30 LAB — RAD ONC ARIA SESSION SUMMARY
Course Elapsed Days: 1
Plan Fractions Treated to Date: 2
Plan Prescribed Dose Per Fraction: 2 Gy
Plan Total Fractions Prescribed: 35
Plan Total Prescribed Dose: 70 Gy
Reference Point Dosage Given to Date: 4 Gy
Reference Point Session Dosage Given: 2 Gy
Session Number: 2

## 2024-07-03 ENCOUNTER — Inpatient Hospital Stay

## 2024-07-03 ENCOUNTER — Ambulatory Visit

## 2024-07-04 ENCOUNTER — Ambulatory Visit
Admission: RE | Admit: 2024-07-04 | Discharge: 2024-07-04 | Attending: Radiation Oncology | Admitting: Radiation Oncology

## 2024-07-04 ENCOUNTER — Other Ambulatory Visit: Payer: Self-pay

## 2024-07-04 ENCOUNTER — Ambulatory Visit

## 2024-07-04 LAB — RAD ONC ARIA SESSION SUMMARY
Course Elapsed Days: 5
Plan Fractions Treated to Date: 3
Plan Prescribed Dose Per Fraction: 2 Gy
Plan Total Fractions Prescribed: 35
Plan Total Prescribed Dose: 70 Gy
Reference Point Dosage Given to Date: 6 Gy
Reference Point Session Dosage Given: 2 Gy
Session Number: 3

## 2024-07-05 ENCOUNTER — Ambulatory Visit
Admission: RE | Admit: 2024-07-05 | Discharge: 2024-07-05 | Disposition: A | Source: Ambulatory Visit | Attending: Radiation Oncology | Admitting: Radiation Oncology

## 2024-07-05 ENCOUNTER — Other Ambulatory Visit: Payer: Self-pay

## 2024-07-05 ENCOUNTER — Inpatient Hospital Stay

## 2024-07-05 LAB — RAD ONC ARIA SESSION SUMMARY
Course Elapsed Days: 6
Plan Fractions Treated to Date: 4
Plan Prescribed Dose Per Fraction: 2 Gy
Plan Total Fractions Prescribed: 35
Plan Total Prescribed Dose: 70 Gy
Reference Point Dosage Given to Date: 8 Gy
Reference Point Session Dosage Given: 2 Gy
Session Number: 4

## 2024-07-06 ENCOUNTER — Other Ambulatory Visit: Payer: Self-pay

## 2024-07-06 ENCOUNTER — Ambulatory Visit
Admission: RE | Admit: 2024-07-06 | Discharge: 2024-07-06 | Disposition: A | Source: Ambulatory Visit | Attending: Radiation Oncology | Admitting: Radiation Oncology

## 2024-07-06 LAB — RAD ONC ARIA SESSION SUMMARY
Course Elapsed Days: 7
Plan Fractions Treated to Date: 5
Plan Prescribed Dose Per Fraction: 2 Gy
Plan Total Fractions Prescribed: 35
Plan Total Prescribed Dose: 70 Gy
Reference Point Dosage Given to Date: 10 Gy
Reference Point Session Dosage Given: 2 Gy
Session Number: 5

## 2024-07-07 ENCOUNTER — Other Ambulatory Visit: Payer: Self-pay

## 2024-07-07 ENCOUNTER — Ambulatory Visit: Admission: RE | Admit: 2024-07-07 | Source: Ambulatory Visit

## 2024-07-07 LAB — RAD ONC ARIA SESSION SUMMARY
Course Elapsed Days: 8
Plan Fractions Treated to Date: 6
Plan Prescribed Dose Per Fraction: 2 Gy
Plan Total Fractions Prescribed: 35
Plan Total Prescribed Dose: 70 Gy
Reference Point Dosage Given to Date: 12 Gy
Reference Point Session Dosage Given: 2 Gy
Session Number: 6

## 2024-07-10 ENCOUNTER — Ambulatory Visit

## 2024-07-11 ENCOUNTER — Ambulatory Visit

## 2024-07-12 ENCOUNTER — Ambulatory Visit

## 2024-07-13 ENCOUNTER — Ambulatory Visit

## 2024-07-14 ENCOUNTER — Ambulatory Visit

## 2024-07-17 ENCOUNTER — Ambulatory Visit

## 2024-07-17 ENCOUNTER — Inpatient Hospital Stay

## 2024-07-18 ENCOUNTER — Ambulatory Visit

## 2024-07-19 ENCOUNTER — Ambulatory Visit

## 2024-07-20 ENCOUNTER — Ambulatory Visit

## 2024-07-21 ENCOUNTER — Ambulatory Visit

## 2024-07-24 ENCOUNTER — Ambulatory Visit

## 2024-07-25 ENCOUNTER — Ambulatory Visit

## 2024-07-26 ENCOUNTER — Ambulatory Visit

## 2024-07-27 ENCOUNTER — Ambulatory Visit

## 2024-07-28 ENCOUNTER — Ambulatory Visit

## 2024-07-31 ENCOUNTER — Ambulatory Visit

## 2024-07-31 ENCOUNTER — Inpatient Hospital Stay

## 2024-08-01 ENCOUNTER — Ambulatory Visit

## 2024-08-02 ENCOUNTER — Ambulatory Visit

## 2024-08-03 ENCOUNTER — Ambulatory Visit

## 2024-08-04 ENCOUNTER — Ambulatory Visit

## 2024-08-07 ENCOUNTER — Ambulatory Visit

## 2024-08-08 ENCOUNTER — Ambulatory Visit

## 2024-08-09 ENCOUNTER — Ambulatory Visit

## 2024-08-10 ENCOUNTER — Ambulatory Visit

## 2024-08-11 ENCOUNTER — Ambulatory Visit

## 2024-08-14 ENCOUNTER — Ambulatory Visit

## 2024-08-14 ENCOUNTER — Inpatient Hospital Stay

## 2024-08-15 ENCOUNTER — Ambulatory Visit

## 2024-08-16 ENCOUNTER — Ambulatory Visit

## 2024-08-17 ENCOUNTER — Ambulatory Visit

## 2024-12-13 ENCOUNTER — Other Ambulatory Visit

## 2024-12-18 ENCOUNTER — Ambulatory Visit: Admitting: Urology
